# Patient Record
Sex: Male | Born: 1985 | Race: Black or African American | Hispanic: No | Marital: Single | Smoking: Current every day smoker
Health system: Southern US, Community
[De-identification: ages and names within clinical notes are randomized; demographics above are authoritative.]

## PROBLEM LIST (undated history)

## (undated) DIAGNOSIS — K4090 Unilateral inguinal hernia, without obstruction or gangrene, not specified as recurrent: Secondary | ICD-10-CM

## (undated) HISTORY — PX: HERNIA REPAIR: SHX51

---
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
## 2008-03-09 ENCOUNTER — Emergency Department (HOSPITAL_COMMUNITY): Admission: EM | Admit: 2008-03-09 | Discharge: 2008-03-09 | Payer: Self-pay | Admitting: Emergency Medicine

## 2008-04-05 ENCOUNTER — Emergency Department (HOSPITAL_COMMUNITY): Admission: EM | Admit: 2008-04-05 | Discharge: 2008-04-05 | Payer: Self-pay | Admitting: Emergency Medicine

## 2008-04-25 ENCOUNTER — Emergency Department (HOSPITAL_COMMUNITY): Admission: EM | Admit: 2008-04-25 | Discharge: 2008-04-25 | Payer: Self-pay | Admitting: Emergency Medicine

## 2008-04-28 ENCOUNTER — Emergency Department (HOSPITAL_COMMUNITY): Admission: EM | Admit: 2008-04-28 | Discharge: 2008-04-28 | Payer: Self-pay | Admitting: Emergency Medicine

## 2008-08-29 ENCOUNTER — Emergency Department (HOSPITAL_COMMUNITY): Admission: EM | Admit: 2008-08-29 | Discharge: 2008-08-29 | Payer: Self-pay | Admitting: Emergency Medicine

## 2009-06-17 ENCOUNTER — Emergency Department (HOSPITAL_COMMUNITY): Admission: EM | Admit: 2009-06-17 | Discharge: 2009-06-18 | Payer: Self-pay | Admitting: Emergency Medicine

## 2009-06-20 ENCOUNTER — Emergency Department (HOSPITAL_COMMUNITY): Admission: EM | Admit: 2009-06-20 | Discharge: 2009-06-20 | Payer: Self-pay | Admitting: Emergency Medicine

## 2009-06-24 ENCOUNTER — Emergency Department (HOSPITAL_COMMUNITY): Admission: EM | Admit: 2009-06-24 | Discharge: 2009-06-24 | Payer: Self-pay | Admitting: Emergency Medicine

## 2009-11-03 ENCOUNTER — Emergency Department (HOSPITAL_COMMUNITY): Admission: EM | Admit: 2009-11-03 | Discharge: 2009-11-03 | Payer: Self-pay | Admitting: Emergency Medicine

## 2010-10-15 ENCOUNTER — Emergency Department (HOSPITAL_COMMUNITY)
Admission: EM | Admit: 2010-10-15 | Discharge: 2010-10-15 | Payer: Self-pay | Source: Home / Self Care | Admitting: Emergency Medicine

## 2010-12-11 LAB — CBC
HCT: 41.5 % (ref 39.0–52.0)
Hemoglobin: 14 g/dL (ref 13.0–17.0)
MCHC: 33.9 g/dL (ref 30.0–36.0)
MCV: 90.5 fL (ref 78.0–100.0)
Platelets: 328 10*3/uL (ref 150–400)
RBC: 4.58 MIL/uL (ref 4.22–5.81)
RDW: 14.3 % (ref 11.5–15.5)
WBC: 12.5 10*3/uL — ABNORMAL HIGH (ref 4.0–10.5)

## 2010-12-11 LAB — COMPREHENSIVE METABOLIC PANEL
ALT: 13 U/L (ref 0–53)
Alkaline Phosphatase: 75 U/L (ref 39–117)
BUN: 8 mg/dL (ref 6–23)
Chloride: 102 mEq/L (ref 96–112)
Glucose, Bld: 107 mg/dL — ABNORMAL HIGH (ref 70–99)
Potassium: 4.3 mEq/L (ref 3.5–5.1)
Sodium: 136 mEq/L (ref 135–145)
Total Bilirubin: 0.5 mg/dL (ref 0.3–1.2)

## 2010-12-11 LAB — DIFFERENTIAL
Basophils Absolute: 0 10*3/uL (ref 0.0–0.1)
Basophils Relative: 0 % (ref 0–1)
Eosinophils Absolute: 0.3 10*3/uL (ref 0.0–0.7)
Monocytes Absolute: 1.1 10*3/uL — ABNORMAL HIGH (ref 0.1–1.0)
Neutro Abs: 9 10*3/uL — ABNORMAL HIGH (ref 1.7–7.7)
Neutrophils Relative %: 72 % (ref 43–77)

## 2010-12-26 LAB — WOUND CULTURE

## 2010-12-28 ENCOUNTER — Emergency Department (HOSPITAL_COMMUNITY)
Admission: EM | Admit: 2010-12-28 | Discharge: 2010-12-28 | Disposition: A | Discharge status: Home / Self Care | Payer: Self-pay | Attending: Emergency Medicine | Admitting: Emergency Medicine

## 2010-12-28 DIAGNOSIS — K029 Dental caries, unspecified: Secondary | ICD-10-CM | POA: Insufficient documentation

## 2010-12-28 DIAGNOSIS — K089 Disorder of teeth and supporting structures, unspecified: Secondary | ICD-10-CM | POA: Insufficient documentation

## 2010-12-28 DIAGNOSIS — R220 Localized swelling, mass and lump, head: Secondary | ICD-10-CM | POA: Insufficient documentation

## 2010-12-28 DIAGNOSIS — K047 Periapical abscess without sinus: Secondary | ICD-10-CM | POA: Insufficient documentation

## 2010-12-28 DIAGNOSIS — R221 Localized swelling, mass and lump, neck: Secondary | ICD-10-CM | POA: Insufficient documentation

## 2010-12-30 ENCOUNTER — Emergency Department (HOSPITAL_COMMUNITY): Payer: Self-pay

## 2010-12-30 ENCOUNTER — Emergency Department (HOSPITAL_COMMUNITY)
Admission: EM | Admit: 2010-12-30 | Discharge: 2010-12-30 | Disposition: A | Discharge status: Home / Self Care | Payer: Self-pay | Attending: Emergency Medicine | Admitting: Emergency Medicine

## 2010-12-30 DIAGNOSIS — R1310 Dysphagia, unspecified: Secondary | ICD-10-CM | POA: Insufficient documentation

## 2010-12-30 DIAGNOSIS — Y929 Unspecified place or not applicable: Secondary | ICD-10-CM | POA: Insufficient documentation

## 2010-12-30 DIAGNOSIS — K1370 Unspecified lesions of oral mucosa: Secondary | ICD-10-CM | POA: Insufficient documentation

## 2010-12-30 DIAGNOSIS — T360X5A Adverse effect of penicillins, initial encounter: Secondary | ICD-10-CM | POA: Insufficient documentation

## 2010-12-30 DIAGNOSIS — R220 Localized swelling, mass and lump, head: Secondary | ICD-10-CM | POA: Insufficient documentation

## 2010-12-30 DIAGNOSIS — R6884 Jaw pain: Secondary | ICD-10-CM | POA: Insufficient documentation

## 2010-12-30 LAB — POCT I-STAT, CHEM 8
BUN: 7 mg/dL (ref 6–23)
Creatinine, Ser: 1.3 mg/dL (ref 0.4–1.5)
Glucose, Bld: 105 mg/dL — ABNORMAL HIGH (ref 70–99)
Hemoglobin: 13.3 g/dL (ref 13.0–17.0)
Potassium: 3.6 mEq/L (ref 3.5–5.1)
Sodium: 143 mEq/L (ref 135–145)

## 2010-12-30 MED ORDER — IOHEXOL 300 MG/ML  SOLN
100.0000 mL | Freq: Once | INTRAMUSCULAR | Status: AC | PRN
  Administered 2010-12-30: 100 mL via INTRAVENOUS

## 2012-04-20 ENCOUNTER — Encounter (HOSPITAL_COMMUNITY): Payer: Self-pay | Admitting: Emergency Medicine

## 2012-04-20 ENCOUNTER — Emergency Department (HOSPITAL_COMMUNITY): Payer: Self-pay

## 2012-04-20 ENCOUNTER — Emergency Department (HOSPITAL_COMMUNITY)
Admission: EM | Admit: 2012-04-20 | Discharge: 2012-04-20 | Disposition: A | Discharge status: Home / Self Care | Payer: Self-pay | Attending: Emergency Medicine | Admitting: Emergency Medicine

## 2012-04-20 ENCOUNTER — Telehealth (INDEPENDENT_AMBULATORY_CARE_PROVIDER_SITE_OTHER): Payer: Self-pay

## 2012-04-20 DIAGNOSIS — K4090 Unilateral inguinal hernia, without obstruction or gangrene, not specified as recurrent: Secondary | ICD-10-CM

## 2012-04-20 DIAGNOSIS — K4091 Unilateral inguinal hernia, without obstruction or gangrene, recurrent: Secondary | ICD-10-CM

## 2012-04-20 DIAGNOSIS — I861 Scrotal varices: Secondary | ICD-10-CM | POA: Insufficient documentation

## 2012-04-20 LAB — URINALYSIS, ROUTINE W REFLEX MICROSCOPIC
Bilirubin Urine: NEGATIVE
Glucose, UA: NEGATIVE mg/dL
Hgb urine dipstick: NEGATIVE
Specific Gravity, Urine: 1.026 (ref 1.005–1.030)
Urobilinogen, UA: 4 mg/dL — ABNORMAL HIGH (ref 0.0–1.0)

## 2012-04-20 LAB — URINE MICROSCOPIC-ADD ON

## 2012-04-20 MED ORDER — OXYCODONE-ACETAMINOPHEN 5-325 MG PO TABS
2.0000 | ORAL_TABLET | Freq: Once | ORAL | Status: AC
  Administered 2012-04-20: 2 via ORAL
  Filled 2012-04-20: qty 2

## 2012-04-20 MED ORDER — OXYCODONE-ACETAMINOPHEN 5-325 MG PO TABS: 2.0000 | ORAL_TABLET | ORAL | Status: AC | PRN

## 2012-04-20 NOTE — ED Notes (Addendum)
Patient transported to ultrasound.

## 2012-04-20 NOTE — ED Notes (Signed)
PT. REPORTS RIGHT TESTICULAR PAIN /SWELLING FOR SEVERAL DAYS WORSE THIS EVENING , DENIES INJURY .

## 2012-04-20 NOTE — ED Notes (Signed)
Dr Norlene Campbell at bedside for ultrasound. Pt tolerating well.

## 2012-04-20 NOTE — Telephone Encounter (Signed)
I called and gave the pt an appointment for August 28th to come in and discuss his hernia per Dr Janee Morn

## 2012-04-20 NOTE — Consult Note (Signed)
Reason for Consult:Right inguinal hernia Referring Physician: Khaled Herda is an 26 y.o. male.  HPI: Patient developed right inguinal pain and swelling a few months ago after lifting some heavy kegs at [employer] where he works.  The pain and swelling in his right groin and then coming and going for several weeks. It worsened over the past day or 2 and he came to the emergency department for further evaluation. He was found to have right inguinal hernia on examination and Dr.Otter asked to see him in consultation. He also underwent scrotal ultrasound. I reviewed this with the radiologist. It demonstrates good arterial and venous blood flow to both testicles and omental fat is visualized in the right inguinal hernia. No evidence of significant hydrocele.  History reviewed. No pertinent past medical history.  History reviewed. No pertinent past surgical history.  No family history on file.  Social History:  reports that he has been smoking.  He does not have any smokeless tobacco history on file. He reports that he does not drink alcohol or use illicit drugs.  Allergies:  Allergies  Allergen Reactions  . Aspirin Swelling    Swelling under tongue    Medications: I have reviewed the patient's current medications.  No results found for this or any previous visit (from the past 48 hour(s)).  US Scrotum  04/20/2012  *RADIOLOGY REPORT*  Clinical Data: Right scrotal pain and swelling  ULTRASOUND OF SCROTUM  Technique:  Complete ultrasound examination of the testicles, epididymis, and other scrotal structures was performed.  Comparison:  None.  Findings:  Right testis:  Measures 4.0 x 2.4 x 2.9 cm.  Color Doppler flow with arterial and venous wave forms documented.  Left testis:  Measures 4.1 x 2.4 x 2.8 cm.  Color Doppler flow with arterial venous wave forms documented.  Right epididymis:  Normal size and appearance.  Left epididymis:  Normal size and appearance.  Hydrocele:  Not identified   Varicocele:  Identified on the left  Fullness in the region of the right inguinal canal may reflect inflammation of the spermatic cord or herniated mesenteric fat.  IMPRESSION: Color Doppler flow with arterial and venous wave forms documented to both testicles.  Normal sonographic appearance to the testicles and epididymides.  Fullness of the right inguinal canal may reflect a fat containing inguinal hernia or inflammation of the spermatic cord.  Original Report Authenticated By: Waneta Martins, M.D.   [place] Art/ven Flow Abd Pelv Doppler  04/20/2012  *RADIOLOGY REPORT*  Clinical Data: Right scrotal pain and swelling  ULTRASOUND OF SCROTUM  Technique:  Complete ultrasound examination of the testicles, epididymis, and other scrotal structures was performed.  Comparison:  None.  Findings:  Right testis:  Measures 4.0 x 2.4 x 2.9 cm.  Color Doppler flow with arterial and venous wave forms documented.  Left testis:  Measures 4.1 x 2.4 x 2.8 cm.  Color Doppler flow with arterial venous wave forms documented.  Right epididymis:  Normal size and appearance.  Left epididymis:  Normal size and appearance.  Hydrocele:  Not identified  Varicocele:  Identified on the left  Fullness in the region of the right inguinal canal may reflect inflammation of the spermatic cord or herniated mesenteric fat.  IMPRESSION: Color Doppler flow with arterial and venous wave forms documented to both testicles.  Normal sonographic appearance to the testicles and epididymides.  Fullness of the right inguinal canal may reflect a fat containing inguinal hernia or inflammation of the spermatic cord.  Original Report Authenticated By: Waneta Martins, M.D.    Review of Systems  Constitutional: Negative.   HENT: Negative.   Eyes: Negative.   Respiratory: Negative.   Cardiovascular: Negative.   Gastrointestinal: Negative for nausea, vomiting, abdominal pain, diarrhea and constipation.  Genitourinary:       Right scrotal swelling  intermittently, see history of present illness  Musculoskeletal: Negative.   Skin: Negative.   Neurological: Negative.    Blood pressure 158/67, pulse 83, temperature 98.5 F (36.9 C), temperature source Oral, resp. rate 20, SpO2 99.00%. Physical Exam  Constitutional: He is oriented to person, place, and time. He appears well-developed and well-nourished. No distress.  Eyes: Pupils are equal, round, and reactive to light. No scleral icterus.       Mild conjunctival injection  Neck: Normal range of motion. Neck supple. No tracheal deviation present.  Cardiovascular: Normal rate, regular rhythm and normal heart sounds.   Respiratory: Effort normal and breath sounds normal. No respiratory distress. He has no wheezes.  GI: Soft. He exhibits no distension. There is no tenderness. There is no rebound and no guarding.       Inguinal exam reveals both testes are descended. He has a large right inguinal hernia extending into the scrotum. This is partially reducible with mild tenderness. It tends to spontaneously recur. No left inguinal hernia.  Genitourinary: Penis normal.  Musculoskeletal: Normal range of motion.  Neurological: He is alert and oriented to person, place, and time.       Moves all extremities well, voice clear and fluent  Skin:       Tattoos bilateral upper extremity    Assessment/Plan: Large right inguinal hernia extending into the scrotum which is becoming more symptomatic. I will see the patient in my office to schedule elective repair. The pain and swelling in his right groin occurred after lifting some heavy kegs at work.  He plans to speak with his supervisor regarding possible workers compensation. I discussed the natural history of inguinal hernias as well as the details of surgery for repair. I also gave him symptoms to look for should incarceration or strangulation occur. I answered his questions. I also spoke with Dr.Otter who plans on giving the patient some discharge  instructions and pain medication. I will see him in the office.  Boen Sterbenz E 04/20/2012, 3:02 AM

## 2012-04-20 NOTE — ED Notes (Signed)
Pt states friend driving home

## 2012-04-25 NOTE — ED Provider Notes (Signed)
History     CSN: [id number]  Arrival date & time 04/20/12  0057   First MD Initiated Contact with Patient 04/20/12 0112      Chief Complaint  Patient presents with  . Testicle Pain    (Consider location/radiation/quality/duration/timing/severity/associated sxs/prior treatment) HPI  26 yo male presents to the ER with c/o right testicular pain and swelling.  Pt reports sxs have been ongoing for about a month, worse over the last week and much worse today.  Pt denies any injury, no new sexual partners, no penile d/c, no rectal pain, no groin swelling or bulge.  Pt has not seen a doctor about this before.  Pt reports his scrotum has been steadily swelling.  No h/o torsion.  History reviewed. No pertinent past medical history.  History reviewed. No pertinent past surgical history.  No family history on file.  History  Substance Use Topics  . Smoking status: Current Everyday Smoker  . Smokeless tobacco: Not on file  . Alcohol Use: No      Review of Systems  All other systems reviewed and are negative.    Allergies  Aspirin  Home Medications   Current Outpatient Rx  Name Route Sig Dispense Refill  . OXYCODONE-ACETAMINOPHEN 5-325 MG PO TABS Oral Take 2 tablets by mouth every 4 (four) hours as needed for pain. 20 tablet 0    BP 151/78  Pulse 68  Temp 98.5 F (36.9 C) (Oral)  Resp 20  SpO2 99%  Physical Exam  Nursing note and vitals reviewed. Constitutional: He is oriented to person, place, and time. He appears well-developed and well-nourished.  HENT:  Head: Normocephalic and atraumatic.  Nose: Nose normal.  Mouth/Throat: Oropharynx is clear and moist.  Eyes: Conjunctivae and EOM are normal. Pupils are equal, round, and reactive to light.  Neck: Normal range of motion. Neck supple. No JVD present. No tracheal deviation present. No thyromegaly present.  Cardiovascular: Normal rate, regular rhythm, normal heart sounds and intact distal pulses.  Exam reveals no  gallop and no friction rub.   No murmur heard. Pulmonary/Chest: Effort normal and breath sounds normal. No stridor. No respiratory distress. He has no wheezes. He has no rales. He exhibits no tenderness.  Abdominal: Soft. Bowel sounds are normal. He exhibits no distension and no mass. There is no tenderness. There is no rebound and no guarding. Inguinal: large swelling to right scrotum, scrotal skin tense.  bedside u/s does not show bowel, nl testes lie.  Genitourinary: Penis normal. Right testis shows mass, swelling and tenderness. Right testis is descended. Cremasteric reflex is not absent on the right side. Left testis shows no mass, no swelling and no tenderness. Left testis is descended. Cremasteric reflex is not absent on the left side. No penile tenderness.  Musculoskeletal: Normal range of motion. He exhibits no edema and no tenderness.  Lymphadenopathy:    He has no cervical adenopathy.  Neurological: He is alert and oriented to person, place, and time. He exhibits normal muscle tone. Coordination normal.  Skin: Skin is warm and dry. No rash noted. No erythema. No pallor.  Psychiatric: He has a normal mood and affect. His behavior is normal. Judgment and thought content normal.    ED Course  Procedures (including critical care time)    Results for orders placed during the hospital encounter of 04/20/12  URINALYSIS, ROUTINE W REFLEX MICROSCOPIC      Component Value Range   Color, Urine YELLOW  YELLOW   APPearance CLEAR  CLEAR  Specific Gravity, Urine 1.026  1.005 - 1.030   pH 7.5  5.0 - 8.0   Glucose, UA NEGATIVE  NEGATIVE mg/dL   Hgb urine dipstick NEGATIVE  NEGATIVE   Bilirubin Urine NEGATIVE  NEGATIVE   Ketones, ur NEGATIVE  NEGATIVE mg/dL   Protein, ur NEGATIVE  NEGATIVE mg/dL   Urobilinogen, UA 4.0 (*) 0.0 - 1.0 mg/dL   Nitrite NEGATIVE  NEGATIVE   Leukocytes, UA MODERATE (*) NEGATIVE  URINE MICROSCOPIC-ADD ON      Component Value Range   Squamous Epithelial / LPF  RARE  RARE   WBC, UA 21-50  <3 WBC/hpf   Bacteria, UA RARE  RARE   US Scrotum  04/20/2012  *RADIOLOGY REPORT*  Clinical Data: Right scrotal pain and swelling  ULTRASOUND OF SCROTUM  Technique:  Complete ultrasound examination of the testicles, epididymis, and other scrotal structures was performed.  Comparison:  None.  Findings:  Right testis:  Measures 4.0 x 2.4 x 2.9 cm.  Color Doppler flow with arterial and venous wave forms documented.  Left testis:  Measures 4.1 x 2.4 x 2.8 cm.  Color Doppler flow with arterial venous wave forms documented.  Right epididymis:  Normal size and appearance.  Left epididymis:  Normal size and appearance.  Hydrocele:  Not identified  Varicocele:  Identified on the left  Fullness in the region of the right inguinal canal may reflect inflammation of the spermatic cord or herniated mesenteric fat.  IMPRESSION: Color Doppler flow with arterial and venous wave forms documented to both testicles.  Normal sonographic appearance to the testicles and epididymides.  Fullness of the right inguinal canal may reflect a fat containing inguinal hernia or inflammation of the spermatic cord.  Original Report Authenticated By: Waneta Martins, M.D.   [place] Art/ven Flow Abd Pelv Doppler  04/20/2012  *RADIOLOGY REPORT*  Clinical Data: Right scrotal pain and swelling  ULTRASOUND OF SCROTUM  Technique:  Complete ultrasound examination of the testicles, epididymis, and other scrotal structures was performed.  Comparison:  None.  Findings:  Right testis:  Measures 4.0 x 2.4 x 2.9 cm.  Color Doppler flow with arterial and venous wave forms documented.  Left testis:  Measures 4.1 x 2.4 x 2.8 cm.  Color Doppler flow with arterial venous wave forms documented.  Right epididymis:  Normal size and appearance.  Left epididymis:  Normal size and appearance.  Hydrocele:  Not identified  Varicocele:  Identified on the left  Fullness in the region of the right inguinal canal may reflect inflammation of the  spermatic cord or herniated mesenteric fat.  IMPRESSION: Color Doppler flow with arterial and venous wave forms documented to both testicles.  Normal sonographic appearance to the testicles and epididymides.  Fullness of the right inguinal canal may reflect a fat containing inguinal hernia or inflammation of the spermatic cord.  Original Report Authenticated By: Waneta Martins, M.D.      1. Inguinal hernia recurrent unilateral       MDM  26 yo male with right inguinal hernia.  Unable to reduce without spontaneous return of contents.  D/w surgery who has seen in the ED and will f/u with outpatient.        Olivia Mackie, MD 04/25/12 2119

## 2012-05-18 ENCOUNTER — Ambulatory Visit (INDEPENDENT_AMBULATORY_CARE_PROVIDER_SITE_OTHER): Payer: Self-pay | Admitting: General Surgery

## 2012-06-01 ENCOUNTER — Ambulatory Visit (INDEPENDENT_AMBULATORY_CARE_PROVIDER_SITE_OTHER): Payer: Self-pay | Admitting: General Surgery

## 2012-06-01 ENCOUNTER — Encounter (INDEPENDENT_AMBULATORY_CARE_PROVIDER_SITE_OTHER): Payer: Self-pay | Admitting: General Surgery

## 2012-06-01 VITALS — BP 119/77 | HR 84 | Temp 97.5°F | Resp 18 | Ht 69.5 in | Wt 208.8 lb

## 2012-06-01 DIAGNOSIS — K4090 Unilateral inguinal hernia, without obstruction or gangrene, not specified as recurrent: Secondary | ICD-10-CM | POA: Insufficient documentation

## 2012-06-01 NOTE — Progress Notes (Signed)
Patient ID: Patrick Pierce, male   DOB: 01-02-1986, 26 y.o.   MRN: [id number]  Chief Complaint  Patient presents with  . Hernia    HPI Patrick Pierce is a 26 y.o. male.  RIH HPI I saw the patient in the emergency room for large writing hernia. It extended into the scrotum but has been reducible. His pain is better but the hernia still goes in and out frequently and gives him pain. No change in bowel or bladder habits.  History reviewed. No pertinent past medical history.  History reviewed. No pertinent past surgical history.  History reviewed. No pertinent family history.  Social History History  Substance Use Topics  . Smoking status: Current Every Day Smoker -- 1.5 packs/day  . Smokeless tobacco: Not on file  . Alcohol Use: No    Allergies  Allergen Reactions  . Aspirin Swelling    Swelling under tongue    No current outpatient prescriptions on file.    Review of Systems Review of Systems  Constitutional: Negative for fever, chills and unexpected weight change.  HENT: Negative for hearing loss, congestion, sore throat, trouble swallowing and voice change.   Eyes: Negative for visual disturbance.  Respiratory: Negative for cough and wheezing.   Cardiovascular: Negative for chest pain, palpitations and leg swelling.  Gastrointestinal: Negative for nausea, vomiting, abdominal pain, diarrhea, constipation, blood in stool, abdominal distention, anal bleeding and rectal pain.       See history of present illness, writing hernia  Genitourinary: Positive for scrotal swelling. Negative for hematuria and difficulty urinating.  Musculoskeletal: Negative for arthralgias.  Skin: Negative for rash and wound.  Neurological: Negative for seizures, syncope, weakness and headaches.  Hematological: Negative for adenopathy. Does not bruise/bleed easily.  Psychiatric/Behavioral: Negative for confusion.    Blood pressure 119/77, pulse 84, temperature 97.5 F (36.4 C), temperature source  Temporal, resp. rate 18, height 5' 9.5" (1.765 m), weight 208 lb 12.8 oz (94.711 kg).  Physical Exam Physical Exam  Constitutional: He is oriented to person, place, and time. He appears well-developed and well-nourished.  HENT:  Head: Normocephalic and atraumatic.  Eyes: EOM are normal. Pupils are equal, round, and reactive to light.  Neck: Normal range of motion. No tracheal deviation present.  Cardiovascular: Normal rate, regular rhythm, normal heart sounds and intact distal pulses.   Pulmonary/Chest: Effort normal and breath sounds normal. No stridor. No respiratory distress. He has no wheezes. He has no rales.  Abdominal: Soft. Bowel sounds are normal. He exhibits no distension. There is no tenderness. There is no rebound.       Large right inguinal hernia extends into the scrotum but is reducible, mild discomfort, no left inguinal hernia, testicles non-edematous  Genitourinary: Penis normal.  Musculoskeletal: Normal range of motion.  Neurological: He is alert and oriented to person, place, and time.  Skin: Skin is warm.       Multiple tattoos    Data Reviewed Hospital consult  Assessment    Large right inguinal hernia    Plan    I've offered repair of this large renal hernia with mesh. This will be done under general anesthesia. Procedure, risks, and benefits were discussed in detail with the patient. He is agreeable. He is checking on his insurance from work but would like to schedule.       Madine Sarr E 06/01/2012, 9:57 AM

## 2012-10-20 ENCOUNTER — Emergency Department (HOSPITAL_COMMUNITY)
Admission: EM | Admit: 2012-10-20 | Discharge: 2012-10-20 | Disposition: A | Discharge status: Home / Self Care | Payer: No Typology Code available for payment source | Attending: Emergency Medicine | Admitting: Emergency Medicine

## 2012-10-20 ENCOUNTER — Encounter (HOSPITAL_COMMUNITY): Payer: Self-pay | Admitting: *Deleted

## 2012-10-20 DIAGNOSIS — Y92410 Unspecified street and highway as the place of occurrence of the external cause: Secondary | ICD-10-CM | POA: Insufficient documentation

## 2012-10-20 DIAGNOSIS — Z8719 Personal history of other diseases of the digestive system: Secondary | ICD-10-CM | POA: Insufficient documentation

## 2012-10-20 DIAGNOSIS — F17200 Nicotine dependence, unspecified, uncomplicated: Secondary | ICD-10-CM | POA: Insufficient documentation

## 2012-10-20 DIAGNOSIS — S43499A Other sprain of unspecified shoulder joint, initial encounter: Secondary | ICD-10-CM | POA: Insufficient documentation

## 2012-10-20 DIAGNOSIS — Y9389 Activity, other specified: Secondary | ICD-10-CM | POA: Insufficient documentation

## 2012-10-20 DIAGNOSIS — S46812A Strain of other muscles, fascia and tendons at shoulder and upper arm level, left arm, initial encounter: Secondary | ICD-10-CM

## 2012-10-20 DIAGNOSIS — S199XXA Unspecified injury of neck, initial encounter: Secondary | ICD-10-CM | POA: Insufficient documentation

## 2012-10-20 DIAGNOSIS — S0993XA Unspecified injury of face, initial encounter: Secondary | ICD-10-CM | POA: Insufficient documentation

## 2012-10-20 HISTORY — DX: Unilateral inguinal hernia, without obstruction or gangrene, not specified as recurrent: K40.90

## 2012-10-20 MED ORDER — CYCLOBENZAPRINE HCL 10 MG PO TABS: 10.0000 mg | ORAL_TABLET | Freq: Three times a day (TID) | ORAL | Status: DC | PRN

## 2012-10-20 MED ORDER — ACETAMINOPHEN 325 MG PO TABS
650.0000 mg | ORAL_TABLET | Freq: Once | ORAL | Status: AC
  Administered 2012-10-20: 650 mg via ORAL
  Filled 2012-10-20: qty 2
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
## 2012-10-21 NOTE — ED Provider Notes (Signed)
Medical screening examination/treatment/procedure(s) were performed by non-physician practitioner and as supervising physician I was immediately available for consultation/collaboration.   Glynn Octave, MD 10/21/12 0005

## 2013-04-26 ENCOUNTER — Emergency Department (HOSPITAL_COMMUNITY)
Admission: EM | Admit: 2013-04-26 | Discharge: 2013-04-26 | Discharge status: Against Medical Advice | Payer: Self-pay | Attending: Emergency Medicine | Admitting: Emergency Medicine

## 2013-04-26 ENCOUNTER — Encounter (HOSPITAL_COMMUNITY): Payer: Self-pay | Admitting: Emergency Medicine

## 2013-04-26 DIAGNOSIS — R109 Unspecified abdominal pain: Secondary | ICD-10-CM | POA: Insufficient documentation

## 2013-04-26 DIAGNOSIS — R1909 Other intra-abdominal and pelvic swelling, mass and lump: Secondary | ICD-10-CM | POA: Insufficient documentation

## 2013-04-26 DIAGNOSIS — K4090 Unilateral inguinal hernia, without obstruction or gangrene, not specified as recurrent: Secondary | ICD-10-CM | POA: Insufficient documentation

## 2013-04-26 DIAGNOSIS — F17200 Nicotine dependence, unspecified, uncomplicated: Secondary | ICD-10-CM | POA: Insufficient documentation

## 2013-04-26 NOTE — ED Notes (Signed)
Called Patient x3. No answer. Registration called x2. No answer.

## 2013-04-26 NOTE — ED Notes (Signed)
Pt c/o hernia to right groin x 6 months with pain and swelling

## 2013-10-18 ENCOUNTER — Emergency Department (HOSPITAL_COMMUNITY): Payer: Self-pay

## 2013-10-18 ENCOUNTER — Emergency Department (HOSPITAL_COMMUNITY)
Admission: EM | Admit: 2013-10-18 | Discharge: 2013-10-18 | Disposition: A | Discharge status: Home / Self Care | Payer: Self-pay | Attending: Emergency Medicine | Admitting: Emergency Medicine

## 2013-10-18 ENCOUNTER — Encounter (HOSPITAL_COMMUNITY): Payer: Self-pay | Admitting: Emergency Medicine

## 2013-10-18 DIAGNOSIS — R059 Cough, unspecified: Secondary | ICD-10-CM

## 2013-10-18 DIAGNOSIS — IMO0002 Reserved for concepts with insufficient information to code with codable children: Secondary | ICD-10-CM | POA: Insufficient documentation

## 2013-10-18 DIAGNOSIS — J069 Acute upper respiratory infection, unspecified: Secondary | ICD-10-CM | POA: Insufficient documentation

## 2013-10-18 DIAGNOSIS — Z8719 Personal history of other diseases of the digestive system: Secondary | ICD-10-CM | POA: Insufficient documentation

## 2013-10-18 DIAGNOSIS — R05 Cough: Secondary | ICD-10-CM

## 2013-10-18 DIAGNOSIS — F17200 Nicotine dependence, unspecified, uncomplicated: Secondary | ICD-10-CM | POA: Insufficient documentation

## 2013-10-18 MED ORDER — FLUTICASONE PROPIONATE 50 MCG/ACT NA SUSP
2.0000 | Freq: Every day | NASAL | Status: DC
Start: 2013-10-18 — End: 2015-07-05

## 2013-10-18 NOTE — Discharge Instructions (Signed)
Rest, stay well-hydrated. Use Flonase as directed. He may also use nasal saline rinses. Take Tylenol or ibuprofen every 6 hours as needed for your pain.   Cough, Adult  A cough is a reflex that helps clear your throat and airways. It can help heal the body or may be a reaction to an irritated airway. A cough may only last 2 or 3 weeks (acute) or may last more than 8 weeks (chronic).  CAUSES Acute cough:  Viral or bacterial infections. Chronic cough:  Infections.  Allergies.  Asthma.  Post-nasal drip.  Smoking.  Heartburn or acid reflux.  Some medicines.  Chronic lung problems (COPD).  Cancer. SYMPTOMS   Cough.  Fever.  Chest pain.  Increased breathing rate.  High-pitched whistling sound when breathing (wheezing).  Colored mucus that you cough up (sputum). TREATMENT   A bacterial cough may be treated with antibiotic medicine.  A viral cough must run its course and will not respond to antibiotics.  Your caregiver may recommend other treatments if you have a chronic cough. HOME CARE INSTRUCTIONS   Only take over-the-counter or prescription medicines for pain, discomfort, or fever as directed by your caregiver. Use cough suppressants only as directed by your caregiver.  Use a cold steam vaporizer or humidifier in your bedroom or home to help loosen secretions.  Sleep in a semi-upright position if your cough is worse at night.  Rest as needed.  Stop smoking if you smoke. SEEK IMMEDIATE MEDICAL CARE IF:   You have pus in your sputum.  Your cough starts to worsen.  You cannot control your cough with suppressants and are losing sleep.  You begin coughing up blood.  You have difficulty breathing.  You develop pain which is getting worse or is uncontrolled with medicine.  You have a fever. MAKE SURE YOU:   Understand these instructions.  Will watch your condition.  Will get help right away if you are not doing well or get worse. Document  Released: 03/06/2011 Document Revised: 11/30/2011 Document Reviewed: 03/06/2011 [facility] Patient Information 2014 [place], [place].  Upper Respiratory Infection, Adult An upper respiratory infection (URI) is also sometimes known as the common cold. The upper respiratory tract includes the nose, sinuses, throat, trachea, and bronchi. Bronchi are the airways leading to the lungs. Most people improve within 1 week, but symptoms can last up to 2 weeks. A residual cough may last even longer.  CAUSES Many different viruses can infect the tissues lining the upper respiratory tract. The tissues become irritated and inflamed and often become very moist. Mucus production is also common. A cold is contagious. You can easily spread the virus to others by oral contact. This includes kissing, sharing a glass, coughing, or sneezing. Touching your mouth or nose and then touching a surface, which is then touched by another person, can also spread the virus. SYMPTOMS  Symptoms typically develop 1 to 3 days after you come in contact with a cold virus. Symptoms vary from person to person. They may include:  Runny nose.  Sneezing.  Nasal congestion.  Sinus irritation.  Sore throat.  Loss of voice (laryngitis).  Cough.  Fatigue.  Muscle aches.  Loss of appetite.  Headache.  Low-grade fever. DIAGNOSIS  You might diagnose your own cold based on familiar symptoms, since most people get a cold 2 to 3 times a year. Your caregiver can confirm this based on your exam. Most importantly, your caregiver can check that your symptoms are not due to another disease such  as strep throat, sinusitis, pneumonia, asthma, or epiglottitis. Blood tests, throat tests, and X-rays are not necessary to diagnose a common cold, but they may sometimes be helpful in excluding other more serious diseases. Your caregiver will decide if any further tests are required. RISKS AND COMPLICATIONS  You may be at risk for a more severe case  of the common cold if you smoke cigarettes, have chronic heart disease (such as heart failure) or lung disease (such as asthma), or if you have a weakened immune system. The very young and very old are also at risk for more serious infections. Bacterial sinusitis, middle ear infections, and bacterial pneumonia can complicate the common cold. The common cold can worsen asthma and chronic obstructive pulmonary disease (COPD). Sometimes, these complications can require emergency medical care and may be life-threatening. PREVENTION  The best way to protect against getting a cold is to practice good hygiene. Avoid oral or hand contact with people with cold symptoms. Wash your hands often if contact occurs. There is no clear evidence that vitamin C, vitamin E, echinacea, or exercise reduces the chance of developing a cold. However, it is always recommended to get plenty of rest and practice good nutrition. TREATMENT  Treatment is directed at relieving symptoms. There is no cure. Antibiotics are not effective, because the infection is caused by a virus, not by bacteria. Treatment may include:  Increased fluid intake. Sports drinks offer valuable electrolytes, sugars, and fluids.  Breathing heated mist or steam (vaporizer or shower).  Eating chicken soup or other clear broths, and maintaining good nutrition.  Getting plenty of rest.  Using gargles or lozenges for comfort.  Controlling fevers with ibuprofen or acetaminophen as directed by your caregiver.  Increasing usage of your inhaler if you have asthma. Zinc gel and zinc lozenges, taken in the first 24 hours of the common cold, can shorten the duration and lessen the severity of symptoms. Pain medicines may help with fever, muscle aches, and throat pain. A variety of non-prescription medicines are available to treat congestion and runny nose. Your caregiver can make recommendations and may suggest nasal or lung inhalers for other symptoms.  HOME CARE  INSTRUCTIONS   Only take over-the-counter or prescription medicines for pain, discomfort, or fever as directed by your caregiver.  Use a warm mist humidifier or inhale steam from a shower to increase air moisture. This may keep secretions moist and make it easier to breathe.  Drink enough water and fluids to keep your urine clear or pale yellow.  Rest as needed.  Return to work when your temperature has returned to normal or as your caregiver advises. You may need to stay home longer to avoid infecting others. You can also use a face mask and careful hand washing to prevent spread of the virus. SEEK MEDICAL CARE IF:   After the first few days, you feel you are getting worse rather than better.  You need your caregiver's advice about medicines to control symptoms.  You develop chills, worsening shortness of breath, or brown or red sputum. These may be signs of pneumonia.  You develop yellow or brown nasal discharge or pain in the face, especially when you bend forward. These may be signs of sinusitis.  You develop a fever, swollen neck glands, pain with swallowing, or white areas in the back of your throat. These may be signs of strep throat. SEEK IMMEDIATE MEDICAL CARE IF:   You have a fever.  You develop severe or persistent headache, ear  pain, sinus pain, or chest pain.  You develop wheezing, a prolonged cough, cough up blood, or have a change in your usual mucus (if you have chronic lung disease).  You develop sore muscles or a stiff neck. Document Released: 03/03/2001 Document Revised: 11/30/2011 Document Reviewed: 01/09/2011 [facility] Patient Information 2014 [place], [place].

## 2013-10-18 NOTE — ED Provider Notes (Signed)
CSN: [id number]     Arrival date & time 10/18/13  0804 History   First MD Initiated Contact with Patient 10/18/13 [phone]     Chief Complaint  Patient presents with  . Cough   (Consider location/radiation/quality/duration/timing/severity/associated sxs/prior Treatment) HPI Comments: Patient is a 28 year old male who presents to the emergency department complaining of cough and nasal congestion x2 days. Cough is occasionally productive with yellow mucus. States over the past day he has developed a sharp left-sided rib pain when he coughs only. This morning he was little nauseated. Denies fever, sore throat, earache. No sick contacts. No aggravating or alleviating factors. He is a smoker.  Patient is a 28 y.o. male presenting with cough. The history is provided by the patient.  Cough   Past Medical History  Diagnosis Date  . Hernia of testicle    History reviewed. No pertinent past surgical history. No family history on file. History  Substance Use Topics  . Smoking status: Current Every Day Smoker -- 0.50 packs/day    Types: Cigarettes  . Smokeless tobacco: Not on file  . Alcohol Use: No    Review of Systems  HENT: Positive for congestion.   Respiratory: Positive for cough.   Gastrointestinal: Positive for nausea.  All other systems reviewed and are negative.    Allergies  Aspirin  Home Medications   Current Outpatient Rx  Name  Route  Sig  Dispense  Refill  . DM-Doxylamine-Acetaminophen (TYLENOL COUGH/SORE THROAT) 15-6.25-500 MG/15ML MISC   Oral   Take 30 mLs by mouth 2 (two) times daily as needed (for cough).         . fluticasone (FLONASE) 50 MCG/ACT nasal spray   Each Nare   Place 2 sprays into both nostrils daily.   16 g   0    BP 124/61  Pulse 80  Temp(Src) 98 F (36.7 C)  Resp 16  SpO2 99% Physical Exam  Nursing note and vitals reviewed. Constitutional: He is oriented to person, place, and time. He appears well-developed and well-nourished. No  distress.  HENT:  Head: Normocephalic and atraumatic.  Nose: Mucosal edema present. Right sinus exhibits maxillary sinus tenderness. Left sinus exhibits maxillary sinus tenderness.  Mouth/Throat: Oropharynx is clear and moist.  Nasal congestion, post nasal drip.  Eyes: Conjunctivae are normal.  Neck: Normal range of motion. Neck supple.  Cardiovascular: Normal rate, regular rhythm and normal heart sounds.   Pulmonary/Chest: Effort normal. No respiratory distress. He exhibits no tenderness.  Ronchi left upper lung field cleared with cough.  Abdominal: Soft. Bowel sounds are normal. There is no tenderness.  Musculoskeletal: Normal range of motion. He exhibits no edema.  Neurological: He is alert and oriented to person, place, and time.  Skin: Skin is warm and dry. He is not diaphoretic.  Psychiatric: He has a normal mood and affect. His behavior is normal.    ED Course  Procedures (including critical care time) Labs Review Labs Reviewed - No data to display Imaging Review Dg Chest 2 View  10/18/2013   CLINICAL DATA:  28 year old male with cough and left-sided chest pain.  EXAM: CHEST  2 VIEW  COMPARISON:  DG CHEST 2 VIEW dated 06/24/2009  FINDINGS: normal lung volumes. Normal cardiac size and mediastinal contours. Visualized tracheal air column is within normal limits. The lungs are clear. No pneumothorax or effusion. Minimal upper thoracic scoliosis is stable. Otherwise no osseous abnormality identified.  IMPRESSION: Negative, no acute cardiopulmonary abnormality.   Electronically Signed   By: Nedra Hai  Margo AyeHall M.D.   On: 10/18/2013 09:16    EKG Interpretation   None       MDM   1. URI (upper respiratory infection)   2. Cough    Patient presented with cough and congestion. He is well appearing and in no apparent distress, afebrile with normal vital signs. Chest x-ray clear. Will give Flonase, discussed symptomatic treatment. Stable for discharge. Return precautions given. Patient states  understanding of treatment care plan and is agreeable.    Trevor MaceRobyn M Albert, PA-C 10/18/13 [phone]

## 2013-10-18 NOTE — ED Notes (Signed)
Pt states cough x 2 days, states has sharp pain to abdomen when he coughs. Also reports nausea in the mornings. No fever, sore throat.

## 2013-10-18 NOTE — ED Provider Notes (Signed)
Medical screening examination/treatment/procedure(s) were performed by non-physician practitioner and as supervising physician I was immediately available for consultation/collaboration.  Megan E Docherty, MD 10/18/13 2012 

## 2014-10-21 ENCOUNTER — Emergency Department (HOSPITAL_COMMUNITY)
Admission: EM | Admit: 2014-10-21 | Discharge: 2014-10-21 | Disposition: A | Discharge status: Home / Self Care | Payer: No Typology Code available for payment source | Attending: Emergency Medicine | Admitting: Emergency Medicine

## 2014-10-21 ENCOUNTER — Encounter (HOSPITAL_COMMUNITY): Payer: Self-pay | Admitting: Emergency Medicine

## 2014-10-21 DIAGNOSIS — Z720 Tobacco use: Secondary | ICD-10-CM | POA: Insufficient documentation

## 2014-10-21 DIAGNOSIS — K047 Periapical abscess without sinus: Secondary | ICD-10-CM

## 2014-10-21 DIAGNOSIS — K029 Dental caries, unspecified: Secondary | ICD-10-CM | POA: Insufficient documentation

## 2014-10-21 DIAGNOSIS — Z7951 Long term (current) use of inhaled steroids: Secondary | ICD-10-CM | POA: Insufficient documentation

## 2014-10-21 MED ORDER — IBUPROFEN 600 MG PO TABS: 600.0000 mg | ORAL_TABLET | Freq: Four times a day (QID) | ORAL | Status: DC | PRN

## 2014-10-21 MED ORDER — AMOXICILLIN 500 MG PO CAPS: 500.0000 mg | ORAL_CAPSULE | Freq: Three times a day (TID) | ORAL | Status: DC

## 2014-10-21 MED ORDER — HYDROCODONE-ACETAMINOPHEN 5-325 MG PO TABS: 1.0000 | ORAL_TABLET | Freq: Four times a day (QID) | ORAL | Status: DC | PRN

## 2014-10-21 NOTE — Discharge Instructions (Signed)
Take ibuprofen for pain. norco for severe pain. Take amoxil for infection until all gone. Follow up with [facility] or with an oral surgeon.   Return if worsening symptoms.    Dental Abscess A dental abscess is a collection of infected fluid (pus) from a bacterial infection in the inner part of the tooth (pulp). It usually occurs at the end of the tooth's root.  CAUSES   Severe tooth decay.  Trauma to the tooth that allows bacteria to enter into the pulp, such as a broken or chipped tooth. SYMPTOMS   Severe pain in and around the infected tooth.  Swelling and redness around the abscessed tooth or in the mouth or face.  Tenderness.  Pus drainage.  Bad breath.  Bitter taste in the mouth.  Difficulty swallowing.  Difficulty opening the mouth.  Nausea.  Vomiting.  Chills.  Swollen neck glands. DIAGNOSIS   A medical and dental history will be taken.  An examination will be performed by tapping on the abscessed tooth.  X-rays may be taken of the tooth to identify the abscess. TREATMENT The goal of treatment is to eliminate the infection. You may be prescribed antibiotic medicine to stop the infection from spreading. A root canal may be performed to save the tooth. If the tooth cannot be saved, it may be pulled (extracted) and the abscess may be drained.  HOME CARE INSTRUCTIONS  Only take over-the-counter or prescription medicines for pain, fever, or discomfort as directed by your caregiver.  Rinse your mouth (gargle) often with salt water ( tsp salt in 8 oz [250 ml] of warm water) to relieve pain or swelling.  Do not drive after taking pain medicine (narcotics).  Do not apply heat to the outside of your face.  Return to your dentist for further treatment as directed. SEEK MEDICAL CARE IF:  Your pain is not helped by medicine.  Your pain is getting worse instead of better. SEEK IMMEDIATE MEDICAL CARE IF:  You have a fever or persistent symptoms for more  than 2-3 days.  You have a fever and your symptoms suddenly get worse.  You have chills or a very bad headache.  You have problems breathing or swallowing.  You have trouble opening your mouth.  You have swelling in the neck or around the eye. Document Released: 09/07/2005 Document Revised: 06/01/2012 Document Reviewed: 12/16/2010 [facility] Patient Information 2015 [place]. This information is not intended to replace advice given to you by your health care provider. Make sure you discuss any questions you have with your health care provider.

## 2014-10-21 NOTE — ED Provider Notes (Signed)
CSN: [id number]     Arrival date & time 10/21/14  1523 History  This chart was scribed for non-physician practitioner, Jaynie Crumbleatyana Doryan Bahl, PA-C working with Nelia Shiobert L Beaton, MD by Greggory StallionKayla Andersen, ED scribe. This patient was seen in room TR06C/TR06C and the patient's care was started at 4:07 PM.   Chief Complaint  Patient presents with  . Dental Pain   The history is provided by the patient. No language interpreter was used.    HPI Comments: Patrick Pierce is a 29 y.o. male who presents to the Emergency Department complaining of intermittent left and right upper dental pain that started one month ago. States pain has started to worsen and become constant. He also reports worsening left sided facial swelling that started earlier today. His right tooth has been chipped for about 2 years. Pt has taken tylenol with no relief and ibuprofen with temporary relief. He does not a dentist. Denies fever, chills.   Past Medical History  Diagnosis Date  . Hernia of testicle    History reviewed. No pertinent past surgical history. No family history on file. History  Substance Use Topics  . Smoking status: Current Every Day Smoker -- 0.50 packs/day    Types: Cigarettes  . Smokeless tobacco: Not on file  . Alcohol Use: No    Review of Systems  Constitutional: Negative for fever and chills.  HENT: Positive for dental problem and facial swelling.   All other systems reviewed and are negative.  Allergies  Aspirin  Home Medications   Prior to Admission medications   Medication Sig Start Date End Date Taking? Authorizing Provider  DM-Doxylamine-Acetaminophen (TYLENOL COUGH/SORE THROAT) 15-6.25-500 MG/15ML MISC Take 30 mLs by mouth 2 (two) times daily as needed (for cough).    Historical Provider, MD  fluticasone (FLONASE) 50 MCG/ACT nasal spray Place 2 sprays into both nostrils daily. 10/18/13   Robyn M Hess, PA-C   BP 142/59 mmHg  Pulse 73  Temp(Src) 98.6 F (37 C)  Resp 16  SpO2 100%   Physical  Exam  Constitutional: He is oriented to person, place, and time. He appears well-developed and well-nourished. No distress.  HENT:  Head: Normocephalic and atraumatic.  Right Ear: Tympanic membrane and ear canal normal.  Left Ear: Tympanic membrane and ear canal normal.  Left facial swelling mainly over maxilla. Left upper 1st molar with carries and surrounding gum swelling TTP. Right upper 2nd bicuspid chipped with central carries, ttp. No surrounding gum swelling. No swelling under the tongue. No trismus.   Eyes: Conjunctivae and EOM are normal.  Neck: Normal range of motion. Neck supple. No tracheal deviation present.  Cardiovascular: Normal rate.   Pulmonary/Chest: Effort normal. No respiratory distress.  Musculoskeletal: Normal range of motion.  Neurological: He is alert and oriented to person, place, and time.  Skin: Skin is warm and dry.  Psychiatric: He has a normal mood and affect. His behavior is normal.  Nursing note and vitals reviewed.   ED Course  Procedures (including critical care time)  DIAGNOSTIC STUDIES: Oxygen Saturation is 100% on RA, normal by my interpretation.    COORDINATION OF CARE: 4:11 PM-Discussed treatment plan which includes an antibiotic and pain medication with pt at bedside and pt agreed to plan. Will give pt dental referrals and advised him to follow up.   Labs Review Labs Reviewed - No data to display  Imaging Review No results found.   EKG Interpretation None      MDM   Final diagnoses:  Dental abscess   Pt's exam consistent with a dental abscess. No evidence of ludwig's angina. Pt afebrile. Non toxic appearing. Home with dental follow up, amoxil for infection, ibuprofen, norco. Return precautions discussed.   Filed Vitals:   10/21/14 1527  BP: 142/59  Pulse: 73  Temp: 98.6 F (37 C)  Resp: 16  SpO2: 100%     I personally performed the services described in this documentation, which was scribed in my presence. The recorded  information has been reviewed and is accurate.  Lottie Mussel, PA-C 10/21/14 1624  Nelia Shi, MD 10/21/14 [phone]

## 2014-10-21 NOTE — ED Notes (Signed)
Declined W/C at D/C and was escorted to lobby by RN. 

## 2014-10-21 NOTE — ED Notes (Signed)
C/o L and R upper toothache x 1 month.  Swelling noted to L side of face.

## 2014-12-23 ENCOUNTER — Encounter (HOSPITAL_COMMUNITY): Payer: Self-pay

## 2014-12-23 ENCOUNTER — Emergency Department (HOSPITAL_COMMUNITY)
Admission: EM | Admit: 2014-12-23 | Discharge: 2014-12-23 | Disposition: A | Discharge status: Home / Self Care | Payer: Self-pay | Attending: Emergency Medicine | Admitting: Emergency Medicine

## 2014-12-23 DIAGNOSIS — Z792 Long term (current) use of antibiotics: Secondary | ICD-10-CM | POA: Insufficient documentation

## 2014-12-23 DIAGNOSIS — K029 Dental caries, unspecified: Secondary | ICD-10-CM | POA: Insufficient documentation

## 2014-12-23 DIAGNOSIS — Z8719 Personal history of other diseases of the digestive system: Secondary | ICD-10-CM | POA: Insufficient documentation

## 2014-12-23 DIAGNOSIS — K047 Periapical abscess without sinus: Secondary | ICD-10-CM | POA: Insufficient documentation

## 2014-12-23 DIAGNOSIS — Z7951 Long term (current) use of inhaled steroids: Secondary | ICD-10-CM | POA: Insufficient documentation

## 2014-12-23 DIAGNOSIS — Z720 Tobacco use: Secondary | ICD-10-CM | POA: Insufficient documentation

## 2014-12-23 MED ORDER — PENICILLIN V POTASSIUM 500 MG PO TABS: 500.0000 mg | ORAL_TABLET | Freq: Four times a day (QID) | ORAL | Status: AC

## 2014-12-23 MED ORDER — HYDROCODONE-ACETAMINOPHEN 5-325 MG PO TABS: 1.0000 | ORAL_TABLET | ORAL | Status: DC | PRN

## 2014-12-23 NOTE — ED Notes (Signed)
Declined W/C at D/C and was escorted to lobby by RN. 

## 2014-12-23 NOTE — ED Notes (Signed)
Pt reports left sided dental pain with swelling x 3 days. Reports similar episode 2 months ago, seen here but unable to afford f/u. Pt denies fever/chills.

## 2014-12-23 NOTE — ED Provider Notes (Signed)
CSN: [id number]     Arrival date & time 12/23/14  0865 History  This chart was scribed for non-physician practitioner Trixie Dredge, PA-C, working with Benjiman Core, MD by Littie Deeds, ED Scribe. This patient was seen in room TR05C/TR05C and the patient's care was started at 10:09 AM.      Chief Complaint  Patient presents with  . Dental Pain   The history is provided by the patient. No language interpreter was used.    HPI Comments: Patrick Pierce is a 29 y.o. male who presents to the Emergency Department complaining of gradual onset, intermittent, pressure-like left lower dental pain with swelling that started 3 days ago. Patient believes he may have a dental abscess. Initially, he only had swelling, but no pain; now he has severe pain to the area. He has tried hot compresses. Patient denies fever, sore throat, difficulty swallowing, and difficulty breathing.   Past Medical History  Diagnosis Date  . Hernia of testicle    History reviewed. No pertinent past surgical history. No family history on file. History  Substance Use Topics  . Smoking status: Current Every Day Smoker -- 0.50 packs/day    Types: Cigarettes  . Smokeless tobacco: Not on file  . Alcohol Use: No    Review of Systems  Constitutional: Negative for fever.  HENT: Positive for dental problem and facial swelling. Negative for trouble swallowing.   Respiratory: Negative for shortness of breath.   Musculoskeletal: Negative for neck pain and neck stiffness.  Skin: Negative for color change and rash.  Allergic/Immunologic: Negative for immunocompromised state.  Hematological: Does not bruise/bleed easily.  Psychiatric/Behavioral: Negative for self-injury.      Allergies  Aspirin  Home Medications   Prior to Admission medications   Medication Sig Start Date End Date Taking? Authorizing Provider  amoxicillin (AMOXIL) 500 MG capsule Take 1 capsule (500 mg total) by mouth 3 (three) times daily. 10/21/14   Jaynie Crumble, PA-C  DM-Doxylamine-Acetaminophen (TYLENOL COUGH/SORE THROAT) 15-6.25-500 MG/15ML MISC Take 30 mLs by mouth 2 (two) times daily as needed (for cough).    Historical Provider, MD  fluticasone (FLONASE) 50 MCG/ACT nasal spray Place 2 sprays into both nostrils daily. 10/18/13   Kathrynn Speed, PA-C  HYDROcodone-acetaminophen (NORCO) 5-325 MG per tablet Take 1 tablet by mouth every 6 (six) hours as needed for moderate pain. 10/21/14   Tatyana Kirichenko, PA-C  ibuprofen (ADVIL,MOTRIN) 600 MG tablet Take 1 tablet (600 mg total) by mouth every 6 (six) hours as needed. 10/21/14   Tatyana Kirichenko, PA-C   BP 140/50 mmHg  Pulse 77  Temp(Src) 97.8 F (36.6 C) (Oral)  Resp 18  SpO2 100% Physical Exam  Constitutional: He appears well-developed and well-nourished. No distress.  HENT:  Head: Normocephalic and atraumatic.  Mouth/Throat: Uvula is midline and oropharynx is clear and moist. Mucous membranes are not dry. No uvula swelling. No oropharyngeal exudate, posterior oropharyngeal edema, posterior oropharyngeal erythema or tonsillar abscesses.  Severe decay of left lower molars to the gumline, with adjacent gingival swelling. Purulent discharge. Associated facial swelling.   Neck: Normal range of motion. Neck supple.  No trismus, fullness, edema or tenderness of the anterior neck or submandibular space.  Cardiovascular: Normal rate.   Pulmonary/Chest: Effort normal and breath sounds normal. No stridor.  Lymphadenopathy:    He has no cervical adenopathy.  Neurological: He is alert.  Skin: He is not diaphoretic.  Nursing note and vitals reviewed.   ED Course  Procedures  DIAGNOSTIC STUDIES:  Oxygen Saturation is 100% on room air, normal by my interpretation.    COORDINATION OF CARE: 10:13 AM-Discussed treatment plan which includes antibiotics, pain medication and follow-up with oral surgeon with pt at bedside and pt agreed to plan.    Labs Review Labs Reviewed - No data to  display  Imaging Review No results found.   EKG Interpretation None      MDM   Final diagnoses:  Dental abscess    Afebrile, nontoxic patient with new dental pain and obvious abscess. No airway concerns. Doubt Ludwig's angina.  D/C home with antibiotic, pain medication and dental follow up.  No oral surgeon on call today.  Multiple resources given.  Discussed findings, treatment, and follow up  with patient.  Pt given return precautions.  Pt verbalizes understanding and agrees with plan.       I personally performed the services described in this documentation, which was scribed in my presence. The recorded information has been reviewed and is accurate.    Trixie Dredgemily Jiya Kissinger, PA-C 12/23/14 1111  Benjiman CoreNathan Pickering, MD 12/26/14 2212

## 2014-12-23 NOTE — Discharge Instructions (Signed)
Read the information below.  Use the prescribed medication as directed.  Please discuss all new medications with your pharmacist.  Do not take additional tylenol while taking the prescribed pain medication to avoid overdose.  You may return to the Emergency Department at any time for worsening condition or any new symptoms that concern you.  Please call the dentist listed above within 48 hours to schedule a close follow up appointment.  If you develop fevers, swelling in your face, difficulty swallowing or breathing, return to the ER immediately for a recheck.   ° ° °Dental Abscess °A dental abscess is a collection of infected fluid (pus) from a bacterial infection in the inner part of the tooth (pulp). It usually occurs at the end of the tooth's root.  °CAUSES  °· Severe tooth decay. °· Trauma to the tooth that allows bacteria to enter into the pulp, such as a broken or chipped tooth. °SYMPTOMS  °· Severe pain in and around the infected tooth. °· Swelling and redness around the abscessed tooth or in the mouth or face. °· Tenderness. °· Pus drainage. °· Bad breath. °· Bitter taste in the mouth. °· Difficulty swallowing. °· Difficulty opening the mouth. °· Nausea. °· Vomiting. °· Chills. °· Swollen neck glands. °DIAGNOSIS  °· A medical and dental history will be taken. °· An examination will be performed by tapping on the abscessed tooth. °· X-rays may be taken of the tooth to identify the abscess. °TREATMENT °The goal of treatment is to eliminate the infection. You may be prescribed antibiotic medicine to stop the infection from spreading. A root canal may be performed to save the tooth. If the tooth cannot be saved, it may be pulled (extracted) and the abscess may be drained.  °HOME CARE INSTRUCTIONS °· Only take over-the-counter or prescription medicines for pain, fever, or discomfort as directed by your caregiver. °· Rinse your mouth (gargle) often with salt water (¼ tsp salt in 8 oz [250 ml] of warm water) to  relieve pain or swelling. °· Do not drive after taking pain medicine (narcotics). °· Do not apply heat to the outside of your face. °· Return to your dentist for further treatment as directed. °SEEK MEDICAL CARE IF: °· Your pain is not helped by medicine. °· Your pain is getting worse instead of better. °SEEK IMMEDIATE MEDICAL CARE IF: °· You have a fever or persistent symptoms for more than 2-3 days. °· You have a fever and your symptoms suddenly get worse. °· You have chills or a very bad headache. °· You have problems breathing or swallowing. °· You have trouble opening your mouth. °· You have swelling in the neck or around the eye. °Document Released: 09/07/2005 Document Revised: 06/01/2012 Document Reviewed: 12/16/2010 °ExitCare® Patient Information ©2015 [employer]. This information is not intended to replace advice given to you by your health care provider. Make sure you discuss any questions you have with your health care provider. ° ° °Emergency Department Resource Guide °1) Find a Doctor and Pay Out of Pocket °Although you won't have to find out who is covered by your insurance plan, it is a good idea to ask around and get recommendations. You will then need to call the office and see if the doctor you have chosen will accept you as a new patient and what types of options they offer for patients who are self-pay. Some doctors offer discounts or will set up payment plans for their patients who do not have insurance, but you   will need to ask so you aren't surprised when you get to your appointment. ° °2) Contact Your Local Health Department °Not all health departments have doctors that can see patients for sick visits, but many do, so it is worth a call to see if yours does. If you don't know where your local health department is, you can check in your phone book. The CDC also has a tool to help you locate your state's health department, and many state websites also have listings of all of their local  health departments. ° °3) Find a Walk-in Clinic °If your illness is not likely to be very severe or complicated, you may want to try a walk in clinic. These are popping up all over the country in pharmacies, drugstores, and shopping centers. They're usually staffed by nurse practitioners or physician assistants that have been trained to treat common illnesses and complaints. They're usually fairly quick and inexpensive. However, if you have serious medical issues or chronic medical problems, these are probably not your best option. ° °No Primary Care Doctor: °- Call [facility] at  832-8000 - they can help you locate a primary care doctor that  accepts your insurance, provides certain services, etc. °- Physician Referral Service- 1-800-533-3463 ° °Chronic Pain Problems: °Organization         Address  Phone   Notes  °[facility]  (336) 297-2271 Patients need to be referred by their primary care doctor.  ° °Medication Assistance: °Organization         Address  Phone   Notes  °[place] [facility] [address] °[place], [place] [address] (336) 641-8030 --Must be a resident of [place] °-- Must have NO insurance coverage whatsoever (no Medicaid/ Medicare, etc.) °-- The pt. MUST have a primary care doctor that directs their care regularly and follows them in the community °  °[facility]  (866) 331-1348   °[employer]  (888) 892-1162   ° °Agencies that provide inexpensive medical care: °Organization         Address  Phone   Notes  °[facility]  (336) 832-8035   °[facility]    (336) 832-7272   °[facility] [address] °[place], [place] [address] (336) 832-4777   °[facility] [address], °[place] (336) 271-4999   °[facility]    (336) 373-0678   °[facility]    (336) 272-1050   °[facility] ° [address], [place] Phone:   (336) 832-4444, Fax:  (336) 832-4440 Hours of Operation:  9 am - 6 pm, M-F.  Also accepts Medicaid/Medicare and self-pay.  °[facility] ° [address], [place] Phone: (336) 832-3150, Fax: (336) 832-3151. Hours of Operation:  8:30 am - 5:30 pm, M-F.  Also accepts Medicaid and self-pay.  °[facility] [place] [address], [place] Phone: (336) 878-6027   °[facility] [address], [place], [place] (336)723-1848, Ext. 123 Mondays & Thursdays: 7-9 AM.  First 15 patients are seen on a first come, first serve basis. °  ° °Medicaid-accepting [place] Providers: ° °Organization         Address  Phone   Notes  °[facility] [address], [place] (336) 641-2100 Also accepts self-pay patients.  °[facility] [address],   [place] ° (336) 856-9996   °[facility] [address], [place] (336) 288-8857   °[facility] [address] [place] [address], [place] (336) 299-7000   °Veita Bland [address], [place]  ° (336) 373-1557 Only accepts [place] Access Medicaid patients after they have their name applied to their card.  ° °Self-Pay (no insurance) in [place]: ° °Organization         Address  Phone   Notes  °Sickle Cell Patients, [facility] [address], [place] (336) 832-1970   °[facility] [address], [place] (336) 832-4400   °[facility] ° [address], [place] (336) 992-4800   °[facility]/Dr. Osei-Bonsu ° [address] [place] [address], [place] or [address], [place] (336) 841-8500 Phone number for both [place] and [place] locations is the same.  °[facility] [address], [place] (336) 299-0000   °[facility] [address] [place] [address], [place] or [address] (336)  852-7530 °(336) 878-2260   °[facility] [address], [place] (336) 350-1642, phone; (336) 294-5005, fax Sees patients 1st and 3rd Saturday of every month.  Must not qualify for public or private insurance (i.e. Medicaid, Medicare, [place] Health Choice, Veterans' Benefits) • Household income should be no more than 200% of the poverty level •The clinic cannot treat you if you are pregnant or think you are pregnant • Sexually transmitted diseases are not treated at the clinic.  ° ° °Dental Care: °Organization         Address  Phone  Notes  °[place] [facility] [address], [place] (336) 641-6152 Accepts children up to age 21 who are enrolled in Medicaid or [place] Health Choice; pregnant women with a Medicaid card; and children who have applied for Medicaid or [place] Health Choice, but were declined, whose parents can pay a reduced fee at time of service.  °[facility] [place]  [address], [place] (336) 641-7733 Accepts children up to age 21 who are enrolled in Medicaid or  Health Choice; pregnant women with a Medicaid card; and children who have applied for Medicaid or  Health Choice, but were declined, whose parents can pay a reduced fee at time of service.  °[facility] ° [address],  (336) 641-4533 Patients are seen by appointment only. Walk-ins are not accepted. [facility] will see patients 18 years of age and older. °Monday - Tuesday (8am-5pm) °Most Wednesdays (8:30-5pm) °$30 per visit, cash only  °[facility] ° [address], [place] (336) 641-4533 Patients are seen by appointment only. Walk-ins are not accepted. [facility] will see patients 18 years of age and older. °One Wednesday Evening (Monthly: Volunteer Based).  $30 per visit, cash only  °[facility]  (919) 537-3737 for adults;  Children under age 4, call Graduate Pediatric Dentistry at (919) [phone]. Children aged 4-14, please call (919) [phone] to request a pediatric application. ° Dental services are provided in all areas of dental care including fillings, crowns and bridges, complete and partial dentures, implants, gum treatment, root canals, and extractions. Preventive care is also provided. Treatment is provided to both adults and children. °Patients are selected via a lottery and   there is often a waiting list. °  °[facility] [address], °[place] ° (336) 763-8833 [email or web address] °  °[facility] [address], [place], [place] (336)723-1848, Ext. 123 Second and Fourth Thursday of each month, opens at 6:30 AM; Clinic ends at 9 AM.  Patients are seen on a first-come first-served basis, and a limited number are seen during each clinic.  ° °[facility] ° [address], [place], [place] (336) 723-7904   Eligibility Requirements °You must have lived in [place], [place], or [place] counties for at least the last three months. °  You cannot be eligible for state or federal sponsored healthcare insurance, including Veterans Administration, Medicaid, or Medicare. °  You generally cannot be eligible for healthcare insurance through your employer.  °  How to apply: °Eligibility screenings are held every Tuesday and Wednesday afternoon from 1:00 pm until 4:00 pm. You do not need an appointment for the interview!  °[facility] [address], [place], [place] 336-631-2330   °[facility] [facility]  336-342-8273   °[facility]  336-703-3100   °[facility]  336-570-6415   ° °Behavioral Health Resources in the Community: °Intensive Outpatient Programs °Organization         Address  Phone  Notes  °[facility] [address], [place], [place] 336-878-6098   °[facility] [address], [place], [place] 336-832-9800   °[facility] [address], [place], [place] ° 336-882-2125   °[facility] [address],  °[place], [place] 1-800-853-5163 or 336-641-4981   °Substance Abuse Resources °Organization         Address  Phone  Notes  °[facility]  336-882-2125   °[facility]  336-784-9470   °[facility]  336-285-9073   °[facility]  336-845-3988   °Residential & Outpatient Substance Abuse Program  1-800-659-3381   °Psychological Services °Organization         Address  Phone  Notes  °[facility]  336- 832-9600   °[facility]  336- 378-7881   °[facility] [address], [place] 1-800-853-5163 or 336-641-4981   ° °Mobile Crisis Teams °Organization         Address  Phone  Notes  °[facility], Mobile Crisis Care Unit  1-877-626-1772   °Assertive °[facility] ° [address] [place], [place] 336-834-9664   °Sharon DeEsch [address] °[place] 336-554-5454   ° °Self-Help/Support Groups °Organization         Address  Phone             Notes  °[facility] - variety of support groups  336- 373-1402 Call for more information  °Narcotics Anonymous (NA), [facility] [address], °[place]  2 meetings at this location  ° °Residential Treatment Programs °Organization         Address  Phone  Notes  °[facility] [address],    °[place]  1-866-801-8205   °[facility] ° [address], [place], [place] 704-293-8524   °[facility] [address], [place] 336-845-3988 Admissions: 8am-3pm M-F  °[facility] [address]    °[place], [place] 336-841-1104   °[facility] [address], [place], [place] 336-379-7146   °  [facility] [address]  °[place], [place] 336-285-9073   °[facility] - Intensive  Outpatient [address], [place], [place] 336-852-3033   °[facility] [address]  °[place], [place] 1-877-615-2722 or 336-784-9470   °[facility] [address] [place], [place] 336-227-7417 Accepts Medicaid  °[facility] [address]  °[place] 1-800-659-3381 Substance Abuse/Addiction Treatment  ° °[place] Behavioral Health Resources °Organization         Address  Phone  Notes  °[facility]  (888) 581-9988   °Julie Brannon, PhD [address] [place], [place]   (336) 349-5553 or (336) 951-0000   °[facility]   [address] °[place], [place] (336) 349-4454   °[facility] [address], [place], [place] (336) 342-8316 Insurance/Medicaid/sponsorship through [facility]  °[facility] [address]                                    [place], [place] (336) 342-8316 Therapy/tele-psych/case  °[facility] [address]  ° [place], [place] (336) 349-2233    °Dr. Arfeen  (336) 349-4544   °[facility] [place]  [employer] [place] [facility] 1) [address],  °2) [address], [place] °3)  [address], [place] (336) 349-3220 °(336) 342-7768 ° °(336) 342-8140   °[place] [facility] (336) 342-1394 or (336) 342-3537 (After Hours)    ° ° ° ° °

## 2015-01-18 NOTE — Progress Notes (Signed)
[facility] [address] regarding Vicodin prescription dropped off on April third and wondering if it can be filled.  Per chart review [facility] pain and prescribed PenV antibiotic and Vicodin 15 tablets for pain. Patient was also given resources for follow up.  Patient has not been seen in the ED since then. Per chart review also noted patient has had similar symptoms before but was unable to afford follow up.   Patient noted to have no pcp or no insurance.  Per Dorene [facility]Natalie [facility] get his prescription filled for his antibiotic.  [facility] notified pharmacist Dorene GrebeNatalie of above.  No further EDCM needs at this time.

## 2015-07-05 ENCOUNTER — Emergency Department (HOSPITAL_COMMUNITY): Payer: Self-pay

## 2015-07-05 ENCOUNTER — Encounter (HOSPITAL_COMMUNITY): Payer: Self-pay | Admitting: Emergency Medicine

## 2015-07-05 ENCOUNTER — Emergency Department (HOSPITAL_COMMUNITY)
Admission: EM | Admit: 2015-07-05 | Discharge: 2015-07-05 | Disposition: A | Discharge status: Home / Self Care | Payer: Self-pay | Attending: Emergency Medicine | Admitting: Emergency Medicine

## 2015-07-05 DIAGNOSIS — Z792 Long term (current) use of antibiotics: Secondary | ICD-10-CM | POA: Insufficient documentation

## 2015-07-05 DIAGNOSIS — Z7951 Long term (current) use of inhaled steroids: Secondary | ICD-10-CM | POA: Insufficient documentation

## 2015-07-05 DIAGNOSIS — Z720 Tobacco use: Secondary | ICD-10-CM | POA: Insufficient documentation

## 2015-07-05 DIAGNOSIS — K4090 Unilateral inguinal hernia, without obstruction or gangrene, not specified as recurrent: Secondary | ICD-10-CM | POA: Insufficient documentation

## 2015-07-05 MED ORDER — HYDROCODONE-ACETAMINOPHEN 5-325 MG PO TABS: 1.0000 | ORAL_TABLET | ORAL | Status: DC | PRN

## 2015-07-05 NOTE — ED Notes (Signed)
PA ordered that pt be sent to wait for acute bed.

## 2015-07-05 NOTE — ED Notes (Signed)
Patient to ED with C/O a right inguinal hernia. States that he has had it for about a year. States that it keeps getting larger and larger over time.  Denies any pain but states that it looks. Bad.  States that he is uninsured and cannot have it fixed right now.  States that his insurance does not start until January. RN notes a large right inguinal hernia.

## 2015-07-05 NOTE — ED Notes (Signed)
Pt c/o hernia x 2-3 years ago. Pt reports not really in pain.

## 2015-07-05 NOTE — Discharge Instructions (Signed)
Take the prescribed medication as directed if needed for pain. Follow-up with [facility] [place] [facility] to have hernia repaired.  Recommend to do this as soon as possible. Return to the ED for new or worsening symptoms.

## 2015-07-05 NOTE — ED Provider Notes (Signed)
CSN: [id number]     Arrival date & time 07/05/15  0920 History   First MD Initiated Contact with Patient 07/05/15 1011     Chief Complaint  Patient presents with  . Hernia     (Consider location/radiation/quality/duration/timing/severity/associated sxs/prior Treatment) The history is provided by the patient and medical records.   This is a 29 year old male here with right inguinal hernia best and present for the past 3 years. Patient states hernia has been getting larger over the years, causing him intermittent pain.  No pain currently. He states he was evaluated by general surgery for this and offered repair, however he does not have any medical insurance and is unable to pay out of pocket for this. He denies any difficulty urinating.  No fever, chills.  VSS.  Past Medical History  Diagnosis Date  . Hernia of testicle    History reviewed. No pertinent past surgical history. No family history on file. Social History  Substance Use Topics  . Smoking status: Current Every Day Smoker -- 0.50 packs/day    Types: Cigarettes  . Smokeless tobacco: None  . Alcohol Use: No    Review of Systems  Genitourinary: Positive for scrotal swelling.  All other systems reviewed and are negative.     Allergies  Aspirin  Home Medications   Prior to Admission medications   Medication Sig Start Date End Date Taking? Authorizing Provider  amoxicillin (AMOXIL) 500 MG capsule Take 1 capsule (500 mg total) by mouth 3 (three) times daily. 10/21/14   Jaynie Crumbleatyana Kirichenko, PA-C  DM-Doxylamine-Acetaminophen (TYLENOL COUGH/SORE THROAT) 15-6.25-500 MG/15ML MISC Take 30 mLs by mouth 2 (two) times daily as needed (for cough).    Historical Provider, MD  fluticasone (FLONASE) 50 MCG/ACT nasal spray Place 2 sprays into both nostrils daily. 10/18/13   Kathrynn Speedobyn M Hess, PA-C  HYDROcodone-acetaminophen (NORCO/VICODIN) 5-325 MG per tablet Take 1 tablet by mouth every 4 (four) hours as needed for moderate pain or severe  pain. 12/23/14   Trixie DredgeEmily West, PA-C   BP 115/98 mmHg  Pulse 81  Temp(Src) 98.6 F (37 C) (Oral)  Resp 18  Ht 5\' 7"  (1.702 m)  Wt 235 lb (106.595 kg)  BMI 36.80 kg/m2  SpO2 99%   Physical Exam  Constitutional: He is oriented to person, place, and time. He appears well-developed and well-nourished. No distress.  HENT:  Head: Normocephalic and atraumatic.  Mouth/Throat: Oropharynx is clear and moist.  Eyes: Conjunctivae and EOM are normal. Pupils are equal, round, and reactive to light.  Neck: Normal range of motion. Neck supple.  Cardiovascular: Normal rate, regular rhythm and normal heart sounds.   Pulmonary/Chest: Effort normal and breath sounds normal. No respiratory distress. He has no wheezes.  Abdominal: Soft. Bowel sounds are normal. A hernia is present. Hernia confirmed positive in the right inguinal area.  Genitourinary:  Very large right inguinal hernia with extension into right scrotum; overall non-tender and reducible; no overlying skin changes or warmth to touch  Musculoskeletal: Normal range of motion. He exhibits no edema.  Neurological: He is alert and oriented to person, place, and time.  Skin: Skin is warm and dry. He is not diaphoretic.  Psychiatric: He has a normal mood and affect.  Nursing note and vitals reviewed.   ED Course  Procedures (including critical care time) Labs Review Labs Reviewed - No data to display  Imaging Review [place] Scrotum  07/05/2015  CLINICAL DATA:  Inguinal hernia. EXAM: ULTRASOUND OF SCROTUM TECHNIQUE: Complete ultrasound examination of the testicles,  epididymis, and other scrotal structures was performed. COMPARISON:  Scrotal ultrasound 04/20/2012. FINDINGS: Right testicle Measurements: 4.0 x 2.6 x 3.0 cm, within normal limits. No mass or microlithiasis visualized. Left testicle Measurements: 4.1 x 2.0 x 3.0 cm, within normal limits. No mass or microlithiasis visualized. Right epididymis:  Normal in size and appearance. Left epididymis:   Normal in size and appearance. Hydrocele:  A moderate right-sided hydrocele appears simple. Varicocele:  Left-sided varicocele is evident. Fat and bowel are herniated into the right inguinal canal. IMPRESSION: 1. Fat and bowel herniated into the right inguinal canal. 2. Moderate size right-sided hydrocele. 3. Small left-sided varicocele. Electronically Signed   By: Marin Roberts M.D.   On: 07/05/2015 12:17   [place] Art/ven Flow Abd Pelv Doppler  07/05/2015  CLINICAL DATA:  Inguinal hernia. EXAM: ULTRASOUND OF SCROTUM TECHNIQUE: Complete ultrasound examination of the testicles, epididymis, and other scrotal structures was performed. COMPARISON:  Scrotal ultrasound 04/20/2012. FINDINGS: Right testicle Measurements: 4.0 x 2.6 x 3.0 cm, within normal limits. No mass or microlithiasis visualized. Left testicle Measurements: 4.1 x 2.0 x 3.0 cm, within normal limits. No mass or microlithiasis visualized. Right epididymis:  Normal in size and appearance. Left epididymis:  Normal in size and appearance. Hydrocele:  A moderate right-sided hydrocele appears simple. Varicocele:  Left-sided varicocele is evident. Fat and bowel are herniated into the right inguinal canal. IMPRESSION: 1. Fat and bowel herniated into the right inguinal canal. 2. Moderate size right-sided hydrocele. 3. Small left-sided varicocele. Electronically Signed   By: Marin Roberts M.D.   On: 07/05/2015 12:17   I have personally reviewed and evaluated these images and lab results as part of my medical decision-making.   EKG Interpretation None      MDM   Final diagnoses:  Inguinal hernia   29 year old male here with right inguinal hernia. Has been increasing in size over the past 3 years. Patient is afebrile, nontoxic. He has a very large right inguinal hernia with extension into the right scrotum. Hernia is overall nontender and is reducible. No skin changes or signs of infection. Given significantly increased size of hernia in  close proximity to testicle, repeat ultrasound was obtained which now reveals bowel involvement. No clinical or ultrasound findings concerning for incarceration/strangulation.  No testicular torsion, however there is noted hydrocele on right and varicocele on left.  Hernia remains non-tender, patient without any current pain.  Patient appears stable for discharge with general surgery follow-up--states he is planning for surgical repair in January when insurance takes effect.  Rx vicodin for PRN pain.  Discussed plan with patient, he/she acknowledged understanding and agreed with plan of care.  Return precautions given for new or worsening symptoms.  Garlon Hatchet, PA-C 07/05/15 1355  Gerhard Munch, MD 07/06/15 1754

## 2015-08-19 ENCOUNTER — Emergency Department (HOSPITAL_COMMUNITY)
Admission: EM | Admit: 2015-08-19 | Discharge: 2015-08-19 | Disposition: A | Discharge status: Home / Self Care | Payer: Self-pay | Attending: Emergency Medicine | Admitting: Emergency Medicine

## 2015-08-19 ENCOUNTER — Encounter (HOSPITAL_COMMUNITY): Payer: Self-pay | Admitting: Emergency Medicine

## 2015-08-19 DIAGNOSIS — J111 Influenza due to unidentified influenza virus with other respiratory manifestations: Secondary | ICD-10-CM | POA: Insufficient documentation

## 2015-08-19 DIAGNOSIS — L0291 Cutaneous abscess, unspecified: Secondary | ICD-10-CM

## 2015-08-19 DIAGNOSIS — L02214 Cutaneous abscess of groin: Secondary | ICD-10-CM | POA: Insufficient documentation

## 2015-08-19 DIAGNOSIS — R69 Illness, unspecified: Secondary | ICD-10-CM

## 2015-08-19 DIAGNOSIS — Z8719 Personal history of other diseases of the digestive system: Secondary | ICD-10-CM | POA: Insufficient documentation

## 2015-08-19 DIAGNOSIS — F17210 Nicotine dependence, cigarettes, uncomplicated: Secondary | ICD-10-CM | POA: Insufficient documentation

## 2015-08-19 LAB — I-STAT CHEM 8, ED
BUN: 9 mg/dL (ref 6–20)
CREATININE: 1.3 mg/dL — AB (ref 0.61–1.24)
Calcium, Ion: 1.05 mmol/L — ABNORMAL LOW (ref 1.12–1.23)
Chloride: 96 mmol/L — ABNORMAL LOW (ref 101–111)
Glucose, Bld: 92 mg/dL (ref 65–99)
HEMATOCRIT: 42 % (ref 39.0–52.0)
HEMOGLOBIN: 14.3 g/dL (ref 13.0–17.0)
POTASSIUM: 4.3 mmol/L (ref 3.5–5.1)
SODIUM: 131 mmol/L — AB (ref 135–145)
TCO2: 24 mmol/L (ref 0–100)

## 2015-08-19 MED ORDER — SULFAMETHOXAZOLE-TRIMETHOPRIM 800-160 MG PO TABS: 1.0000 | ORAL_TABLET | Freq: Two times a day (BID) | ORAL | Status: AC

## 2015-08-19 MED ORDER — SULFAMETHOXAZOLE-TRIMETHOPRIM 800-160 MG PO TABS
1.0000 | ORAL_TABLET | Freq: Once | ORAL | Status: AC
  Administered 2015-08-19: 1 via ORAL
  Filled 2015-08-19: qty 1

## 2015-08-19 MED ORDER — SODIUM CHLORIDE 0.9 % IV BOLUS (SEPSIS)
1000.0000 mL | Freq: Once | INTRAVENOUS | Status: AC
  Administered 2015-08-19: 1000 mL via INTRAVENOUS

## 2015-08-19 NOTE — ED Notes (Addendum)
Pt from home with c/o weakness, body aches, fever, and chills x 1 week.  Denies cough, congestions, runny nose, N/V/D.  Reports OTC medications not helping.  Pt in NAD, ambulatory to room, A&O.

## 2015-08-19 NOTE — ED Notes (Signed)
Pt reports having multiple cysts to right groin, hx of the same, and questioned their relationship to how he was feeling.  Notified Dr Hyacinth MeekerMiller.

## 2015-08-19 NOTE — ED Provider Notes (Signed)
CSN: [id number]     Arrival date & time 08/19/15  4098 History   First MD Initiated Contact with Patient 08/19/15 0818     Chief Complaint  Patient presents with  . Weakness  . Chills     (Consider location/radiation/quality/duration/timing/severity/associated sxs/prior Treatment) HPI Comments: The patient is an otherwise healthy 29 year old male who presents with one week of symptoms that started out as upper respiratory symptoms such as a mild sore throat and a runny nose and congestion, this has changed slightly and now he has mild shortness of breath, diffuse fatigue and myalgias. He states that his mouth feels very dry, he is drinking lots of water but can't seem to stay hydrated, he does not have much appetite but denies any dysuria or rectal bleeding. He does have diarrhea twice a day for the last several days. He denies abdominal pain, chest pain, headache, changes in vision.  He has not had any medications for this problem, it is persistent, gradually worsening  The history is provided by the patient.    Past Medical History  Diagnosis Date  . Hernia of testicle    History reviewed. No pertinent past surgical history. History reviewed. No pertinent family history. Social History  Substance Use Topics  . Smoking status: Current Every Day Smoker -- 0.50 packs/day    Types: Cigarettes  . Smokeless tobacco: Never Used  . Alcohol Use: No    Review of Systems  All other systems reviewed and are negative.     Allergies  Aspirin  Home Medications   Prior to Admission medications   Medication Sig Start Date End Date Taking? Authorizing Provider  ibuprofen (ADVIL,MOTRIN) 200 MG tablet Take 200 mg by mouth every 6 (six) hours as needed for fever.   Yes Historical Provider, MD  Pheniramine-PE-APAP (THERAFLU FLU & SORE THROAT PO) Take 1 Package by mouth every 6 (six) hours as needed (cough/flu symptoms).   Yes Historical Provider, MD  sulfamethoxazole-trimethoprim (BACTRIM  DS,SEPTRA DS) 800-160 MG tablet Take 1 tablet by mouth 2 (two) times daily. 08/19/15 08/26/15  Eber Hong, MD   BP 123/67 mmHg  Pulse 96  Temp(Src) 99.3 F (37.4 C) (Oral)  Resp 18  SpO2 98% Physical Exam  Constitutional: He appears well-developed and well-nourished. No distress.  HENT:  Head: Normocephalic and atraumatic.  Mouth/Throat: Oropharynx is clear and moist. No oropharyngeal exudate.  Normal appearing mucous membranes  Eyes: Conjunctivae and EOM are normal. Pupils are equal, round, and reactive to light. Right eye exhibits no discharge. Left eye exhibits no discharge. No scleral icterus.  Neck: Normal range of motion. Neck supple. No JVD present. No thyromegaly present.  Cardiovascular: Normal rate, regular rhythm, normal heart sounds and intact distal pulses.  Exam reveals no gallop and no friction rub.   No murmur heard. Pulmonary/Chest: Effort normal and breath sounds normal. No respiratory distress. He has no wheezes. He has no rales.  Clear lung sounds, speaks in full sentences  Abdominal: Soft. Bowel sounds are normal. He exhibits no distension and no mass. There is no tenderness.  Genitourinary:  Large inguinal hernia, patient states is unchanged for several years  Musculoskeletal: Normal range of motion. He exhibits tenderness ( Small area of subcentimeter induration in the right inguinal crease). He exhibits no edema.  Lymphadenopathy:    He has no cervical adenopathy.  Neurological: He is alert. Coordination normal.  Skin: Skin is warm and dry. No rash noted. No erythema.  Psychiatric: He has a normal mood and affect.  His behavior is normal.  Nursing note and vitals reviewed.   ED Course  Procedures (including critical care time) Labs Review Labs Reviewed  I-STAT CHEM 8, ED - Abnormal; Notable for the following:    Sodium 131 (*)    Chloride 96 (*)    Creatinine, Ser 1.30 (*)    Calcium, Ion 1.05 (*)    All other components within normal limits     Imaging Review No results found. I have personally reviewed and evaluated these images and lab results as part of my medical decision-making.   EKG Interpretation None      MDM   Final diagnoses:  Influenza-like illness  Abscess    The patient does not appear to be in distress, he is having increased thirst and fatigue, dry mouth, rule out diabetes, possible flulike illness, hydration, otherwise the patient is in no distress.  Labs are unremarkable, glucose is normal, creatinine is 1.3 similar to prior levels, sodium 131. The patient was given IV fluids, states he feels better, we'll place the patient on an antibiotic in case this indurated area in the right inguinal crease is related to a cellulitis or an early abscess. The patient is in agreement with the plan and appears stable for discharge.  Filed Vitals:   08/19/15 0847 08/19/15 0900 08/19/15 0930 08/19/15 1000  BP: 119/71 125/78 147/69 123/67  Pulse: 94 100 100 96  Temp: 99.3 F (37.4 C)     TempSrc: Oral     Resp: 18     SpO2: 97% 100% 96% 98%    Meds given in ED:  Medications  sulfamethoxazole-trimethoprim (BACTRIM DS,SEPTRA DS) 800-160 MG per tablet 1 tablet (not administered)  sodium chloride 0.9 % bolus 1,000 mL (0 mLs Intravenous Stopped 08/19/15 1027)    New Prescriptions   SULFAMETHOXAZOLE-TRIMETHOPRIM (BACTRIM DS,SEPTRA DS) 800-160 MG TABLET    Take 1 tablet by mouth 2 (two) times daily.      Eber HongBrian Kayani Rapaport, MD 08/19/15 [phone]

## 2015-08-19 NOTE — Discharge Instructions (Signed)
Please obtain all of your results from medical records or have your doctors office obtain the results - share them with your doctor - you should be seen at your doctors office in the next 2 days. Call today to arrange your follow up. Take the medications as prescribed. Please review all of the medicines and only take them if you do not have an allergy to them. Please be aware that if you are taking birth control pills, taking other prescriptions, ESPECIALLY ANTIBIOTICS may make the birth control ineffective - if this is the case, either do not engage in sexual activity or use alternative methods of birth control such as condoms until you have finished the medicine and your family doctor says it is OK to restart them. If you are on a blood thinner such as COUMADIN, be aware that any other medicine that you take may cause the coumadin to either work too much, or not enough - you should have your coumadin level rechecked in next 7 days if this is the case.  °?  °It is also a possibility that you have an allergic reaction to any of the medicines that you have been prescribed - Everybody reacts differently to medications and while MOST people have no trouble with most medicines, you may have a reaction such as nausea, vomiting, rash, swelling, shortness of breath. If this is the case, please stop taking the medicine immediately and contact your physician.  °?  °You should return to the ER if you develop severe or worsening symptoms.  ° ° °RESOURCE GUIDE ° °Chronic Pain Problems: °Contact [facility]  [phone] °Patients need to be referred by their primary care doctor. ° °Insufficient Money for Medicine: °Contact [employer]:  call "211."  ° °No Primary Care Doctor: °- Call [facility]  [phone] - can help you locate a primary care doctor that  accepts your insurance, provides certain services, etc. °- Physician Referral Service- 1-800-533-3463 ° °Agencies that provide inexpensive medical  care: °- [facility]  [phone] °- [facility]  [phone] °- [facility]  [phone] °- [facility]  [phone] °- [facility]  [phone] °- [facility]  [phone] ° °Medicaid-accepting [place] Providers: °- [facility]- [address] ° [phone], Mon-Fri 9am-7pm, Sat 9am-1pm °- [facility]- [address], [address] ° [phone] °- [facility]- [address], [address] ° [phone] °- [facility]- [address] [place] [address] ° [phone] °- [facility]- [address], [address], [phone] ° Only accepts [place] [facility] Medicaid patients after they have their name  applied to their card ° °Self Pay (no insurance) in [place]: °- Sickle Cell Patients: Dr Eric Dean, [facility] ° [address], [phone] °- [facility]- [address] ° [phone] °      -     [facility]- [address] °      -     [facility]- see information above (Speak to Pam H if you do not have insurance) °      -  [facility] [place]- [address],  [phone] °      -  [facility]- [address] [place] [address], [phone] °      -  Dr Osei-Bonsu-  [address],   [place], [phone] °      -  [facility] - [address], [phone] °      -  [facility]- [address] [place] [address], [phone], also [address]  [address], [phone] °      -    [facility]- [address], [phone], 1st & 3rd Saturday °       every month, 10am-1pm ° °[facility] °[address] °[place], [place] [address] °([phone] ° °[facility] °[address] °[place], [place] [address] °([phone]) [phone] ° °1) Find a Doctor and Pay Out of Pocket °Although you won't have to find out who is covered by your insurance plan, it is a good idea  to ask around and get recommendations. You will then need to call the office and see if the doctor you have chosen will accept you as a new patient and what types of options they offer for patients who are self-pay. Some doctors offer discounts or will set up payment plans for their patients who do not have insurance, but you will need to ask so you aren't surprised when you get to your appointment. ° °2) Contact Your Local Health Department °Not all health departments have doctors that can see patients for sick visits, but many do, so it is worth a call to see if yours does. If you don't know where your local health department is, you can check in your phone book. The CDC also has a tool to help you locate your state's health department, and many state websites also have listings of all of their local health departments. ° °3) Find a Walk-in Clinic °If your illness is not likely to be very severe or complicated, you may want to try a walk in clinic. These are popping up all over the country in pharmacies, drugstores, and shopping centers. They're usually staffed by nurse practitioners or physician assistants that have been trained to treat common illnesses and complaints. They're usually fairly quick and inexpensive. However, if you have serious medical issues or chronic medical problems, these are probably not your best option ° °STD Testing °- [place] [facility] [place], [facility], [address], [place], phone [phone] or [phone].  Monday - Friday, call for an appointment. °- [place] [facility] [place], [facility], [address], [place], phone [phone] or [phone].  Monday - Friday, call for an appointment. ° °Abuse/Neglect: °- [place] Child Abuse Hotline (336) 641-3795 °- [place] Child Abuse Hotline 800-378-5315 (After Hours) ° °Emergency Shelter:  [facility] (336) 271-5985 ° °Maternity  Homes: °- [facility] (336) 275-9566 °- [facility] (704) 372-4663 ° °MRSA Hotline #:   832-7006 ° °Dental Assistance °If unable to pay or uninsured, contact:  [place] [facility] to become qualified for the adult dental clinic. ° °Patients with Medicaid: [facility] °[address], [phone] °[address], [phone] ° °If unable to pay, or uninsured, contact [place] [facility] ([phone] in [place], [phone] in [place]) to become qualified for the adult dental clinic ° °[facility] °[address] °[place], [place] [address] °(336) 272-4177 °[email or web address] ° °Other Low-Cost Community Dental Services: °- [facility]- [address], [place], [place], [address], [phone], Ext. 123, 2nd and 4th Thursday of the month at 6:30am.  10 clients   each day by appointment, can sometimes see walk-in patients if someone does not show for an appointment. °- [facility]- [address], [place], [place], [address], [phone] °- [facility]- [address], [place], [place], [address], [phone] °- [facility]- [phone] °- [facility]- [phone] °- [facility]- [phone] °-  °

## 2016-04-10 ENCOUNTER — Encounter (HOSPITAL_COMMUNITY): Payer: Self-pay | Admitting: *Deleted

## 2016-04-10 ENCOUNTER — Emergency Department (HOSPITAL_COMMUNITY)
Admission: EM | Admit: 2016-04-10 | Discharge: 2016-04-10 | Disposition: A | Discharge status: Home / Self Care | Payer: No Typology Code available for payment source | Attending: Emergency Medicine | Admitting: Emergency Medicine

## 2016-04-10 DIAGNOSIS — F17210 Nicotine dependence, cigarettes, uncomplicated: Secondary | ICD-10-CM | POA: Insufficient documentation

## 2016-04-10 DIAGNOSIS — K4090 Unilateral inguinal hernia, without obstruction or gangrene, not specified as recurrent: Secondary | ICD-10-CM

## 2016-04-10 DIAGNOSIS — R1110 Vomiting, unspecified: Secondary | ICD-10-CM | POA: Insufficient documentation

## 2016-04-10 NOTE — ED Provider Notes (Signed)
CSN: [id number]     Arrival date & time 04/10/16  4098 History  By signing my name below, I, Patrick Pierce, attest that this documentation has been prepared under the direction and in the presence of Melene Plan, DO. Electronically Signed: [name], ED Scribe. 04/10/2016. 3:59 AM.    Chief Complaint  Patient presents with  . Inguinal Hernia   The history is provided by the patient. No language interpreter was used.   HPI Comments: Patrick Pierce is a 30 y.o. male with a hx of hernia of testicle who presents to the Emergency Department complaining of gradually worsening moderate right suprapubic pain with scrotal swelling consistent with the pain of his known hernia onset 2 days ago. He reports that his hernia is incarcerated. He reports associated one episode of non-bloody vomiting. He states that the pain is worse with cough and movement. No alleviating factors noted. Pt has not tried any medications PTA. He states that he currently works with [employer]. He denies any constipation, fever, or chills.   Past Medical History  Diagnosis Date  . Hernia of testicle    History reviewed. No pertinent past surgical history. No family history on file. Social History  Substance Use Topics  . Smoking status: Current Every Day Smoker -- 0.50 packs/day    Types: Cigarettes  . Smokeless tobacco: Never Used  . Alcohol Use: No    Review of Systems  Constitutional: Negative for fever and chills.  HENT: Negative for congestion and facial swelling.   Eyes: Negative for discharge and visual disturbance.  Respiratory: Negative for shortness of breath.   Cardiovascular: Negative for chest pain and palpitations.  Gastrointestinal: Positive for vomiting and abdominal pain. Negative for diarrhea and constipation.  Genitourinary: Positive for scrotal swelling.  Musculoskeletal: Negative for myalgias and arthralgias.  Skin: Negative for color change and rash.   Neurological: Negative for tremors, syncope and headaches.  Psychiatric/Behavioral: Negative for confusion and dysphoric mood.  All other systems reviewed and are negative.     Allergies  Aspirin  Home Medications   Prior to Admission medications   Medication Sig Start Date End Date Taking? Authorizing Provider  ibuprofen (ADVIL,MOTRIN) 200 MG tablet Take 200 mg by mouth every 6 (six) hours as needed for fever.    Historical Provider, MD  Pheniramine-PE-APAP (THERAFLU FLU & SORE THROAT PO) Take 1 Package by mouth every 6 (six) hours as needed (cough/flu symptoms).    Historical Provider, MD   BP 128/67 mmHg  Pulse 71  Temp(Src) 98.1 F (36.7 C) (Oral)  Resp 18  Ht  (1.702 m)  Wt 228 lb (103.42 kg)  BMI 35.70 kg/m2  SpO2 98% Physical Exam  Constitutional: He is oriented to person, place, and time. He appears well-developed and well-nourished.  HENT:  Head: Normocephalic and atraumatic.  Eyes: EOM are normal. Pupils are equal, round, and reactive to light.  Neck: Normal range of motion. Neck supple. No JVD present.  Cardiovascular: Normal rate, regular rhythm, normal heart sounds and intact distal pulses.  Exam reveals no gallop and no friction rub.   No murmur heard. Pulmonary/Chest: Effort normal and breath sounds normal. No respiratory distress. He has no wheezes.  Abdominal: Soft. He exhibits no distension. There is no tenderness. There is no rebound and no guarding.  Genitourinary:  Swollen right scrotum, indirect inguinal hernia mildly TTP No duskiness to skin or erythema  Musculoskeletal: Normal range of motion.  Neurological: He is alert and oriented  to person, place, and time.  Skin: Skin is warm and dry. No rash noted. No pallor.  Psychiatric: He has a normal mood and affect. His behavior is normal. Judgment normal.  Nursing note and vitals reviewed.   ED Course  Hernia reduction Date/Time: 04/10/2016 4:05 AM Performed by: Adela LankFLOYD, Noa Galvao Authorized by:  Melene PlanFLOYD, Alexi Dorminey Consent: Verbal consent obtained. Risks and benefits: risks, benefits and alternatives were discussed Consent given by: patient Required items: required blood products, implants, devices, and special equipment available Patient identity confirmed: verbally with patient Time out: Immediately prior to procedure a "time out" was called to verify the correct patient, procedure, equipment, support staff and site/side marked as required. Local anesthesia used: no Patient sedated: no Patient tolerance: Patient tolerated the procedure well with no immediate complications   (including critical care time) DIAGNOSTIC STUDIES: Oxygen Saturation is 98% on RA, normal by my interpretation.    COORDINATION OF CARE: 3:58 AM- Pt advised of plan for treatment and pt agrees. [facility]   MDM   Final diagnoses:  None    30 yo M With a chief complaint of right inguinal pain. Patient has a history of a incarcerated hernia. Is never been reduced. Patient has been seen multiple times in the ED for the same. He has not been seen by a surgeon because he has been unable to afford the co-pay. The patient has never had pain with the area before but started a couple days ago. Patient appears to have 2 separate loops of bowel in his scrotum. One was reduced with complete resolution of his symptoms. We'll have him follow-up with general surgery  I personally performed the services described in this documentation, which was scribed in my presence. The recorded information has been reviewed and is accurate.   4:06 AM:  I have discussed the diagnosis/risks/treatment options with the patient and family and believe the pt to be eligible for discharge home to follow-up with Gen surgery. We also discussed returning to the ED immediately if new or worsening sx occur. We discussed the sx which are most concerning (e.g., sudden worsening pain, fever, inability to  tolerate by mouth ) that necessitate immediate return. Medications administered to the patient during their visit and any new prescriptions provided to the patient are listed below.  Medications given during this visit Medications - No data to display  New Prescriptions   No medications on file    The patient appears reasonably screen and/or stabilized for discharge and I doubt any other medical condition or other [facility] requiring further screening, evaluation, or treatment in the ED at this time prior to discharge.     Melene Planan Tanis Burnley, DO 04/10/16 [id number]

## 2016-04-10 NOTE — ED Notes (Signed)
Pt states that he has a known inguinal hernia; pt states that he has had increased pain over the last 2 days; pt states it hurts to cough or move

## 2016-04-10 NOTE — Discharge Instructions (Signed)

## 2016-08-03 ENCOUNTER — Encounter (HOSPITAL_COMMUNITY): Payer: Self-pay | Admitting: Emergency Medicine

## 2016-08-03 ENCOUNTER — Emergency Department (HOSPITAL_COMMUNITY)
Admission: EM | Admit: 2016-08-03 | Discharge: 2016-08-03 | Disposition: A | Discharge status: Home / Self Care | Payer: Self-pay | Attending: Emergency Medicine | Admitting: Emergency Medicine

## 2016-08-03 DIAGNOSIS — K4021 Bilateral inguinal hernia, without obstruction or gangrene, recurrent: Secondary | ICD-10-CM | POA: Insufficient documentation

## 2016-08-03 DIAGNOSIS — F17210 Nicotine dependence, cigarettes, uncomplicated: Secondary | ICD-10-CM | POA: Insufficient documentation

## 2016-08-03 NOTE — ED Triage Notes (Signed)
Pt sts right sided groin pain from hernia worse over last two weeks

## 2016-08-03 NOTE — ED Provider Notes (Signed)
[facility]-EMERGENCY DEPT Provider Note   CSN: [id number] Arrival date & time: 08/03/16  1454     History   Chief Complaint Chief Complaint  Patient presents with  . Groin Pain    HPI Patrick Pierce is a 30 y.o. male.  Pt presents to the ED today because of a hernia.  The pt has had his hernia for years.  He does not have insurance, so has been unable to get it repaired.  The pt said that he is here for a referral to general surgery as he is able to get a loan.  The pt does not want pain medications.       Past Medical History:  Diagnosis Date  . Hernia of testicle     Patient Active Problem List   Diagnosis Date Noted  . Right inguinal hernia 06/01/2012    History reviewed. No pertinent surgical history.     Home Medications    Prior to Admission medications   Medication Sig Start Date End Date Taking? Authorizing Provider  ibuprofen (ADVIL,MOTRIN) 200 MG tablet Take 200 mg by mouth every 6 (six) hours as needed for fever.    Historical Provider, MD  Pheniramine-PE-APAP (THERAFLU FLU & SORE THROAT PO) Take 1 Package by mouth every 6 (six) hours as needed (cough/flu symptoms).    Historical Provider, MD    Family History History reviewed. No pertinent family history.  Social History Social History  Substance Use Topics  . Smoking status: Current Every Day Smoker    Packs/day: 0.50    Types: Cigarettes  . Smokeless tobacco: Never Used  . Alcohol use No     Allergies   Aspirin   Review of Systems Review of Systems  Genitourinary: Positive for scrotal swelling.  All other systems reviewed and are negative.    Physical Exam Updated Vital Signs BP 140/75 (BP Location: Right Arm)   Pulse 73   Temp 98 F (36.7 C) (Oral)   Resp 18   SpO2 97%   Physical Exam  Constitutional: He appears well-developed and well-nourished.  HENT:  Head: Normocephalic and atraumatic.  Right Ear: External ear normal.  Left Ear: External ear normal.  Nose: Nose normal.    Mouth/Throat: Oropharynx is clear and moist.  Eyes: Conjunctivae and EOM are normal. Pupils are equal, round, and reactive to light.  Neck: Normal range of motion. Neck supple.  Cardiovascular: Normal rate, regular rhythm, normal heart sounds and intact distal pulses.   Pulmonary/Chest: Effort normal and breath sounds normal.  Abdominal: Soft. Bowel sounds are normal.  Genitourinary:  Genitourinary Comments: Very large inguinal hernia (cantelope size).  I am not sure if it is all right or bilateral.  I am unable to reduce it.  Pt said it's been like that for years.   Nursing note and vitals reviewed.    ED Treatments / Results  Labs (all labs ordered are listed, but only abnormal results are displayed) Labs Reviewed - No data to display  EKG  EKG Interpretation None       Radiology No results found.  Procedures Procedures (including critical care time)  Medications Ordered in ED Medications - No data to display   Initial Impression / Assessment and Plan / ED Course  I have reviewed the triage vital signs and the nursing notes.  Pertinent labs & imaging results that were available during my care of the patient were reviewed by me and considered in my medical decision making (see chart for details).  Clinical Course    Pt given the number for general surgery to f/u.  He knows to return if worse.  Final Clinical Impressions(s) / ED Diagnoses   Final diagnoses:  Bilateral recurrent inguinal hernia without obstruction or gangrene    New Prescriptions New Prescriptions   No medications on file     Jacalyn LefevreJulie Mekala Winger, MD 08/03/16 1610

## 2016-08-03 NOTE — ED Notes (Signed)
Pt is in stable condition upon d/c and ambulates from ED. 

## 2016-09-22 ENCOUNTER — Ambulatory Visit (HOSPITAL_COMMUNITY)
Admission: EM | Admit: 2016-09-22 | Discharge: 2016-09-22 | Disposition: A | Discharge status: Nursing Home (Includes ICF) | Payer: Self-pay

## 2016-09-22 ENCOUNTER — Encounter (HOSPITAL_COMMUNITY): Payer: Self-pay | Admitting: Emergency Medicine

## 2016-09-22 ENCOUNTER — Emergency Department (HOSPITAL_COMMUNITY)
Admission: EM | Admit: 2016-09-22 | Discharge: 2016-09-22 | Disposition: A | Discharge status: Home / Self Care | Payer: Self-pay | Attending: Emergency Medicine | Admitting: Emergency Medicine

## 2016-09-22 ENCOUNTER — Encounter (HOSPITAL_COMMUNITY): Payer: Self-pay | Admitting: Neurology

## 2016-09-22 DIAGNOSIS — K4000 Bilateral inguinal hernia, with obstruction, without gangrene, not specified as recurrent: Secondary | ICD-10-CM

## 2016-09-22 DIAGNOSIS — K4090 Unilateral inguinal hernia, without obstruction or gangrene, not specified as recurrent: Secondary | ICD-10-CM | POA: Insufficient documentation

## 2016-09-22 DIAGNOSIS — F17210 Nicotine dependence, cigarettes, uncomplicated: Secondary | ICD-10-CM | POA: Insufficient documentation

## 2016-09-22 LAB — I-STAT CG4 LACTIC ACID, ED: LACTIC ACID, VENOUS: 1.14 mmol/L (ref 0.5–1.9)

## 2016-09-22 LAB — CBC WITH DIFFERENTIAL/PLATELET
BASOS ABS: 0 10*3/uL (ref 0.0–0.1)
BASOS PCT: 1 %
EOS ABS: 0.3 10*3/uL (ref 0.0–0.7)
EOS PCT: 4 %
HEMATOCRIT: 43.1 % (ref 39.0–52.0)
Hemoglobin: 14.5 g/dL (ref 13.0–17.0)
Lymphocytes Relative: 54 %
Lymphs Abs: 4.2 10*3/uL — ABNORMAL HIGH (ref 0.7–4.0)
MCH: 31.2 pg (ref 26.0–34.0)
MCHC: 33.6 g/dL (ref 30.0–36.0)
MCV: 92.7 fL (ref 78.0–100.0)
Monocytes Absolute: 0.5 10*3/uL (ref 0.1–1.0)
Monocytes Relative: 6 %
NEUTROS ABS: 2.8 10*3/uL (ref 1.7–7.7)
Neutrophils Relative %: 35 %
PLATELETS: 285 10*3/uL (ref 150–400)
RBC: 4.65 MIL/uL (ref 4.22–5.81)
RDW: 13.6 % (ref 11.5–15.5)
WBC: 7.9 10*3/uL (ref 4.0–10.5)

## 2016-09-22 NOTE — ED Provider Notes (Signed)
[facility]    CSN: [id number] Arrival date & time: 09/22/16  1020     History   Chief Complaint Chief Complaint  Patient presents with  . Hernia    HPI Patrick Pierce is a 31 y.o. male.   The history is provided by the patient.  Male GU Problem  Presenting symptoms: scrotal pain and swelling   Relieved by:  Nothing Worsened by:  Tactile pressure and activity Ineffective treatments:  Elevation of scrotum Associated symptoms: abdominal pain, groin pain and scrotal swelling     Past Medical History:  Diagnosis Date  . Hernia of testicle     Patient Active Problem List   Diagnosis Date Noted  . Right inguinal hernia 06/01/2012    History reviewed. No pertinent surgical history.     Home Medications    Prior to Admission medications   Not on File    Family History History reviewed. No pertinent family history.  Social History Social History  Substance Use Topics  . Smoking status: Current Every Day Smoker    Packs/day: 0.50    Types: Cigarettes  . Smokeless tobacco: Never Used  . Alcohol use No     Allergies   Aspirin   Review of Systems Review of Systems  Constitutional: Positive for activity change.  Gastrointestinal: Positive for abdominal pain.  Genitourinary: Positive for scrotal swelling and testicular pain.  All other systems reviewed and are negative.    Physical Exam Triage Vital Signs ED Triage Vitals  Enc Vitals Group     BP 09/22/16 1119 160/100     Pulse Rate 09/22/16 1119 80     Resp 09/22/16 1119 18     Temp 09/22/16 1119 98.1 F (36.7 C)     Temp Source 09/22/16 1119 Oral     SpO2 09/22/16 1119 98 %     Weight --      Height --      Head Circumference --      Peak Flow --      Pain Score 09/22/16 1123 0     Pain Loc --      Pain Edu? --      Excl. in GC? --    No data found.   Updated Vital Signs BP 160/100 (BP Location: Left Wrist)   Pulse 80   Temp 98.1 F (36.7 C) (Oral)   Resp 18   SpO2 98%     Visual Acuity Right Eye Distance:   Left Eye Distance:   Bilateral Distance:    Right Eye Near:   Left Eye Near:    Bilateral Near:     Physical Exam  Constitutional: He appears well-developed and well-nourished. He appears distressed.  Abdominal: A hernia is present. Hernia confirmed positive in the right inguinal area and confirmed positive in the left inguinal area.  Genitourinary: Right testis shows swelling and tenderness. Left testis shows swelling and tenderness.        UC Treatments / Results  Labs (all labs ordered are listed, but only abnormal results are displayed) Labs Reviewed - No data to display  EKG  EKG Interpretation None       Radiology No results found.  Procedures Procedures (including critical care time)  Medications Ordered in UC Medications - No data to display   Initial Impression / Assessment and Plan / UC Course  I have reviewed the triage vital signs and the nursing notes.  Pertinent labs & imaging results that were available  during my care of the patient were reviewed by me and considered in my medical decision making (see chart for details).  Clinical Course     Sent for gen surgical eval of bilat inc inguinal hernia.  Final Clinical Impressions(s) / UC Diagnoses   Final diagnoses:  None    New Prescriptions New Prescriptions   No medications on file     Linna HoffJames D Alby Schwabe, MD 09/22/16 1204

## 2016-09-22 NOTE — ED Triage Notes (Signed)
Pt here c/o hernia to right groin that he has had for 2 years, but has been "out" for 1 year. Coming from urgent care where he presented for a work note, sent down here to have surgery evaluate. No pain now. Denies any problems urinating or having BM.

## 2016-09-22 NOTE — ED Triage Notes (Signed)
[facility] with a complaint of a hernia in his groin area. The patient reported that he has been diagnosed with a hernia but can not afford surgery. He stated that he is here today for a work note so that he can continue to work.

## 2016-09-22 NOTE — ED Provider Notes (Signed)
[facility]   CSN: [id number] Arrival date & time: 09/22/16  1232     History   Chief Complaint Chief Complaint  Patient presents with  . Hernia    HPI Patrick Pierce is a 31 y.o. male.  The history is provided by the patient and medical records. No language interpreter was used.   Patient presents today after being referred from urgent care for scrotal hernia. Patient states he has had this hernia for more than 3 years but over the last year it is been persistently out. States that his job had recommended he get a doctor's note to be cleared for work. Went to urgent care and after seeing the extent of hernia the patient had they sent him here for further evaluation. Patient denies any pain at the site. He does state that at times he gets pain in his epigastrium when he coughs. Has not had any nausea, vomiting, diarrhea, chest pain, shortness of breath. Denies any prior surgery related to the hernia. No recent fevers or chills. Has had normal bowel movements and last bowel movement was this morning. States that a year ago he is able to push it back in but is no longer able to do so.   Past Medical History:  Diagnosis Date  . Hernia of testicle     Patient Active Problem List   Diagnosis Date Noted  . Right inguinal hernia 06/01/2012    History reviewed. No pertinent surgical history.     Home Medications    Prior to Admission medications   Not on File    Family History No family history on file.  Social History Social History  Substance Use Topics  . Smoking status: Current Every Day Smoker    Packs/day: 0.50    Types: Cigarettes  . Smokeless tobacco: Never Used  . Alcohol use No     Allergies   Aspirin   Review of Systems Review of Systems  Constitutional: Negative for chills and fever.  HENT: Negative for ear pain and sore throat.   Respiratory: Negative for cough and shortness of breath.   Cardiovascular: Negative for chest pain  and palpitations.  Gastrointestinal: Positive for abdominal pain (epigastric when coughing). Negative for vomiting.  Genitourinary: Positive for scrotal swelling (due to extent of hernia). Negative for dysuria and hematuria.  Musculoskeletal: Negative for arthralgias and back pain.  Skin: Negative for color change and rash.  Neurological: Negative for seizures and syncope.  All other systems reviewed and are negative.    Physical Exam Updated Vital Signs BP 99/86   Pulse 74   Temp 98.3 F (36.8 C) (Oral)   Resp 17   Ht 5\' 6"  (1.676 m)   Wt 108.9 kg   SpO2 98%   BMI 38.74 kg/m   Physical Exam  Constitutional: He appears well-developed and well-nourished. No distress.  HENT:  Head: Normocephalic and atraumatic.  Eyes: Conjunctivae are normal.  Neck: Neck supple.  Cardiovascular: Normal rate and regular rhythm.   No murmur heard. Pulmonary/Chest: Effort normal and breath sounds normal. No respiratory distress.  Abdominal: Soft. Bowel sounds are normal. There is no tenderness.  Genitourinary: Penis normal.     Musculoskeletal: He exhibits no edema.  Neurological: He is alert.  Skin: Skin is warm and dry. He is not diaphoretic.  Psychiatric: He has a normal mood and affect.  Nursing note and vitals reviewed.    ED Treatments / Results  Labs (all labs ordered are listed, but  only abnormal results are displayed) Labs Reviewed  CBC WITH DIFFERENTIAL/PLATELET - Abnormal; Notable for the following:       Result Value   Lymphs Abs 4.2 (*)    All other components within normal limits  I-STAT CG4 LACTIC ACID, ED    EKG  EKG Interpretation None       Radiology No results found.  Procedures Hernia reduction Date/Time: 09/22/2016 8:23 PM Performed by: Madolyn FriezeNAGPAL, Syann Cupples Authorized by: Lyndal PulleyKNOTT, DANIEL  Consent: Verbal consent obtained. Consent given by: patient Patient understanding: patient states understanding of the procedure being performed Patient identity  confirmed: verbally with patient Local anesthesia used: no  Anesthesia: Local anesthesia used: no  Sedation: Patient sedated: no Patient tolerance: Patient tolerated the procedure well with no immediate complications Comments: Easily reducible scrotal hernia with constant pressure. Testicles palpated in scrotum following successful reduction.     (including critical care time)  Medications Ordered in ED Medications - No data to display   Initial Impression / Assessment and Plan / ED Course  I have reviewed the triage vital signs and the nursing notes.  Pertinent labs & imaging results that were available during my care of the patient were reviewed by me and considered in my medical decision making (see chart for details).  Clinical Course     Patient is a 31 year old male who is here by urgent care due to concern for scrotal hernia. On exam the patient does have a very large scrotal hernia. It is soft and compressible. No overlying erythema or exquisite tenderness. Lactic acid and white blood cell count are within normal limits. I highly doubt strangulation given his clinical presentation.  We were able to reduce hernia as noted above. Patient tolerated this well. He was given resources for follow-up with general surgeon. Patient cleared for return to work. He was discharged home in good condition.  Final Clinical Impressions(s) / ED Diagnoses   Final diagnoses:  Right inguinal hernia    New Prescriptions There are no discharge medications for this patient.    Madolyn FriezeVijay Talin Feister, MD 09/23/16 [id number]    Lyndal Pulleyaniel Knott, MD 09/23/16 0330

## 2016-11-18 ENCOUNTER — Ambulatory Visit (INDEPENDENT_AMBULATORY_CARE_PROVIDER_SITE_OTHER): Payer: Self-pay | Admitting: Internal Medicine

## 2016-11-18 ENCOUNTER — Encounter: Payer: Self-pay | Admitting: Internal Medicine

## 2016-11-18 VITALS — BP 112/82 | HR 80 | Resp 12 | Ht 67.25 in | Wt 248.0 lb

## 2016-11-18 DIAGNOSIS — Z720 Tobacco use: Secondary | ICD-10-CM

## 2016-11-18 DIAGNOSIS — K4020 Bilateral inguinal hernia, without obstruction or gangrene, not specified as recurrent: Secondary | ICD-10-CM

## 2016-11-18 NOTE — Patient Instructions (Signed)
Tobacco Cessation:   [id number] or [phone], the former for support and possibly free nicotine patches/gum and support; the latter for [facility] Smoking cessation class. Get rid of all smoking supplies:  Cigarettes, lighters, ashtrays--no stashes just in case at home if you are serious.  For nicotine patches:  Stop smoking anything the day you start the first patch Start with 21 mg patch and reapply new to different area of skin every 24 hours for 30 days. Then 14 mg patch changed every 24 hours for 14 days. Then 7 mg patch changed every 24 hours for 14 days.  

## 2016-11-18 NOTE — Progress Notes (Signed)
   Subjective:    Patient ID: Patrick Pierce, male    DOB: [birth year], 31 y.o.   MRN: [id number]  HPI   Here to establish  Feels he has hernias on both sides.  Has had for 2 years, but in past 6 months has worsened and now getting painful in past 2-3 weeks.  Can reduce when recumbent, but comes right back out.  Hurts if coughs or talks too loudly.   Has gained about 60 lbs in past 2 years.  Stress eating.  No outpatient prescriptions have been marked as taking for the 11/18/16 encounter (Office Visit) with Julieanne MansonElizabeth Signa Cheek, MD.    Allergies  Allergen Reactions  . Aspirin Anaphylaxis    Swelling under tongue    Past Medical History:  Diagnosis Date  . Hernia of testicle     History reviewed. No pertinent surgical history.   Family History  Problem Relation Age of Onset  . HIV/AIDS Mother   . Alcohol abuse Mother   . Congenital heart disease Son     Social History   Social History  . Marital status: Single    Spouse name: N/A  . Number of children: N/A  . Years of education: N/A   Occupational History  . Not on file.   Social History Main Topics  . Smoking status: Current Every Day Smoker    Packs/day: 1.00    Years: 15.00    Types: Cigarettes  . Smokeless tobacco: Never Used  . Alcohol use No  . Drug use: No  . Sexual activity: Not on file   Other Topics Concern  . Not on file   Social History Narrative  . No narrative on file           Review of Systems     Objective:   Physical Exam NAD Lungs:  CTA CV:  RRR with normal S1 and S2, No S3, S4 or murmur, Radial and DP pulses normal and equal Abd:  S, NT, No HSM or mass, + BS GU:  Extremely large mass of what appears to be herniated intestine into scrotum bilaterally with loss of midline separation of scrotum, testicles are pushed anteriorlaterally over this mass.  With patient recumbent, difficult to even see where penis is buried in the mass.  On standing, becomes more prominent at upper area of  mass.  Able to palpate a large inguinal ring on the right. Unable to adequately palpate inguinal ring on left. With patient recumbent, unable to reduce this hernia.  No discoloration of tissue.  Herniated mass is soft to palpation.        Assessment & Plan:  Significant bilateral inguinal hernias with inability to reduce today.  This has to be causing significant pain and disability in moving about for this young man.  Urgent referral to General surgery to repair. Discussed if he develops increasing pain, swelling, firmness of hernia tissue to go to ED immediately  Tobacco use:  Discussed using patches to quit prior to surgery

## 2016-12-26 ENCOUNTER — Encounter (HOSPITAL_COMMUNITY): Payer: Self-pay | Admitting: *Deleted

## 2016-12-26 ENCOUNTER — Emergency Department (HOSPITAL_COMMUNITY)
Admission: EM | Admit: 2016-12-26 | Discharge: 2016-12-27 | Disposition: A | Discharge status: Home / Self Care | Payer: Self-pay | Attending: Emergency Medicine | Admitting: Emergency Medicine

## 2016-12-26 ENCOUNTER — Emergency Department (HOSPITAL_COMMUNITY): Payer: Self-pay

## 2016-12-26 DIAGNOSIS — G8918 Other acute postprocedural pain: Secondary | ICD-10-CM | POA: Insufficient documentation

## 2016-12-26 DIAGNOSIS — R1031 Right lower quadrant pain: Secondary | ICD-10-CM | POA: Insufficient documentation

## 2016-12-26 DIAGNOSIS — S3022XA Contusion of scrotum and testes, initial encounter: Secondary | ICD-10-CM

## 2016-12-26 LAB — URINALYSIS, ROUTINE W REFLEX MICROSCOPIC
BILIRUBIN URINE: NEGATIVE
GLUCOSE, UA: NEGATIVE mg/dL
HGB URINE DIPSTICK: NEGATIVE
KETONES UR: NEGATIVE mg/dL
LEUKOCYTES UA: NEGATIVE
Nitrite: NEGATIVE
PH: 5 (ref 5.0–8.0)
PROTEIN: NEGATIVE mg/dL
Specific Gravity, Urine: 1.027 (ref 1.005–1.030)

## 2016-12-26 LAB — CBC WITH DIFFERENTIAL/PLATELET
BASOS ABS: 0 10*3/uL (ref 0.0–0.1)
Basophils Relative: 0 %
EOS ABS: 0.4 10*3/uL (ref 0.0–0.7)
EOS PCT: 3 %
HCT: 38 % — ABNORMAL LOW (ref 39.0–52.0)
Hemoglobin: 12.7 g/dL — ABNORMAL LOW (ref 13.0–17.0)
Lymphocytes Relative: 37 %
Lymphs Abs: 3.9 10*3/uL (ref 0.7–4.0)
MCH: 30.5 pg (ref 26.0–34.0)
MCHC: 33.4 g/dL (ref 30.0–36.0)
MCV: 91.3 fL (ref 78.0–100.0)
MONO ABS: 0.5 10*3/uL (ref 0.1–1.0)
Monocytes Relative: 5 %
Neutro Abs: 5.8 10*3/uL (ref 1.7–7.7)
Neutrophils Relative %: 55 %
PLATELETS: 430 10*3/uL — AB (ref 150–400)
RBC: 4.16 MIL/uL — AB (ref 4.22–5.81)
RDW: 13.3 % (ref 11.5–15.5)
WBC: 10.5 10*3/uL (ref 4.0–10.5)

## 2016-12-26 LAB — I-STAT CHEM 8, ED
BUN: 12 mg/dL (ref 6–20)
CREATININE: 0.9 mg/dL (ref 0.61–1.24)
Calcium, Ion: 1.08 mmol/L — ABNORMAL LOW (ref 1.15–1.40)
Chloride: 107 mmol/L (ref 101–111)
GLUCOSE: 123 mg/dL — AB (ref 65–99)
HCT: 39 % (ref 39.0–52.0)
HEMOGLOBIN: 13.3 g/dL (ref 13.0–17.0)
Potassium: 3.7 mmol/L (ref 3.5–5.1)
Sodium: 141 mmol/L (ref 135–145)
TCO2: 25 mmol/L (ref 0–100)

## 2016-12-26 MED ORDER — IOPAMIDOL (ISOVUE-300) INJECTION 61%
INTRAVENOUS | Status: AC
  Administered 2016-12-26: 100 mL via INTRAVENOUS
  Filled 2016-12-26: qty 100

## 2016-12-26 MED ORDER — MORPHINE SULFATE (PF) 2 MG/ML IV SOLN
4.0000 mg | Freq: Once | INTRAVENOUS | Status: AC
  Administered 2016-12-26: 4 mg via INTRAVENOUS
  Filled 2016-12-26: qty 2

## 2016-12-26 MED ORDER — ONDANSETRON HCL 4 MG/2ML IJ SOLN
4.0000 mg | Freq: Once | INTRAMUSCULAR | Status: AC
  Administered 2016-12-26: 4 mg via INTRAVENOUS
  Filled 2016-12-26: qty 2

## 2016-12-26 NOTE — Discharge Instructions (Signed)
Place an ice pack on the swollen area 4 times daily for 30 minutes at a time. Take Tylenol every 4 hours as needed for pain. Go to the surgical clinic at [facility] Monday, 12/28/2016 between 9 AM and noon. Take the copy of the CT scan with you that your given tonight

## 2016-12-26 NOTE — ED Provider Notes (Addendum)
Patient had elective inguinal hernia surgery repair pair right side at [facility] 12/18/2015. He reports for the past 2 days his scrotum has been swollen. It feels like the hernia has come back. He has pain at the incision site at his right inguinal area and right sides swollen scrotum. It is worse with standing. Improved with scrotal support by his hand He denies fever denies vomiting denies abdominal pain denies change in bowel habits. On exam he is alert and in no distress abdomen soft nontender. At right inguinal area there is a well-healed surgical wound with which is slightly swollen and tender. Scrotum is grossly swollen right side   Doug Sou, MD 12/26/16 2028 Spoke with Dr.Zocher from [facility] surgical service. Plan ice, Tylenol. Patient to go to surgical clinic Monday, 12/28/2016 between 9 AM and noon for reevaluation. Tylenol for pain Dx scrotal hematoma   Doug Sou, MD 12/26/16 2338

## 2016-12-26 NOTE — ED Provider Notes (Signed)
[facility] Note   CSN: [id number] Arrival date & time: 12/26/16  1829     History   Chief Complaint Chief Complaint  Patient presents with  . Groin Pain  . Back Pain    HPI Patrick Pierce is a 31 y.o. male.  HPI   Pt with hx bilateral inguinal hernias s/p repair right inguinal hernia 12/17/16 p/w persistent swelling in the right scrotum, incisional pain, occasional RLQ abdominal pain with urination, and constant cramping low back pain, worse with movement.  Was having good symptoms control with percocet but has run out.  No relief with tylenol.  Denies fevers, vomiting, dysuria.  Is having normal bowel movements.  Does have chronic cough from smoking but denies increase in cough or any SOB or CP.   Past Medical History:  Diagnosis Date  . Hernia of testicle     Patient Active Problem List   Diagnosis Date Noted  . Right inguinal hernia 06/01/2012    History reviewed. No pertinent surgical history.     Home Medications    Prior to Admission medications   Medication Sig Start Date End Date Taking? Authorizing Provider  acetaminophen (TYLENOL) 650 MG CR tablet Take 1,300 mg by mouth every 8 (eight) hours as needed for pain.   Yes Historical Provider, MD    Family History Family History  Problem Relation Age of Onset  . HIV/AIDS Mother   . Alcohol abuse Mother   . Congenital heart disease Son     Social History Social History  Substance Use Topics  . Smoking status: Current Every Day Smoker    Packs/day: 1.00    Years: 15.00    Types: Cigarettes  . Smokeless tobacco: Never Used  . Alcohol use No     Allergies   Aspirin   Review of Systems Review of Systems  All other systems reviewed and are negative.    Physical Exam Updated Vital Signs BP 131/64 (BP Location: Left Arm)   Pulse 91   Temp 98.3 F (36.8 C) (Oral)   Resp 18   Wt 112.5 kg   SpO2 97%   BMI 38.55 kg/m   Physical Exam  Constitutional: He appears well-developed  and well-nourished. No distress.  HENT:  Head: Normocephalic and atraumatic.  Neck: Neck supple.  Cardiovascular: Normal rate and regular rhythm.   Pulmonary/Chest: Effort normal and breath sounds normal. No respiratory distress. He has no wheezes. He has no rales.  Abdominal: Soft. He exhibits no distension and no mass. There is no tenderness. There is no rebound and no guarding.  Genitourinary:  Genitourinary Comments: Enlarged scrotum with large palpable hernia on right.   Right groin with surgical incision without erythema, edema, warmth, or discharge.  Suture line is intact.    Neurological: He is alert. He exhibits normal muscle tone.  Skin: He is not diaphoretic.  Nursing note and vitals reviewed.    ED Treatments / Results  Labs (all labs ordered are listed, but only abnormal results are displayed) Labs Reviewed  URINALYSIS, ROUTINE W REFLEX MICROSCOPIC  CBC WITH DIFFERENTIAL/PLATELET  I-STAT CHEM 8, ED    EKG  EKG Interpretation None       Radiology No results found.  Procedures Procedures (including critical care time)  Medications Ordered in ED Medications  morphine 2 MG/ML injection 4 mg (4 mg Intravenous Given 12/26/16 2159)  ondansetron (ZOFRAN) injection 4 mg (4 mg Intravenous Given 12/26/16 2159)     Initial Impression / Assessment and  Plan / ED Course  I have reviewed the triage vital signs and the nursing notes.  Pertinent labs & imaging results that were available during my care of the patient were reviewed by me and considered in my medical decision making (see chart for details).  Clinical Course as of Dec 26 2199  Sat Dec 26, 2016  2020 Discussed pt with Dr Ethelda Chick who will also see and examine the patient.    [EW]    Clinical Course User Index [EW] Trixie Dredge, PA-C    Pt with recent surgery on right inguinal hernia p/w right scrotal fullness, RLQ abdominal pain, right back pain.  Pending labs, CT abd/pelvis at change of shift.  Dr  Ethelda Chick assumes care of patient.    Final Clinical Impressions(s) / ED Diagnoses   Final diagnoses:  Postoperative pain    New Prescriptions New Prescriptions   No medications on file     Trixie Dredge, PA-C 12/26/16 2201    Doug Sou, MD 12/27/16 [phone]

## 2016-12-26 NOTE — ED Notes (Signed)
Unable to collect labs nurse is going to start IV and collect labs

## 2016-12-26 NOTE — ED Triage Notes (Signed)
Pt complains of groin pain since hernia surgery on 3/29. Pt states he has another hernia that needs repair that was causing pain. Pt also complains of back pain, which began 2 days after surgery.

## 2017-01-05 ENCOUNTER — Other Ambulatory Visit: Payer: Self-pay

## 2017-12-14 IMAGING — CT CT ABD-PELV W/ CM
2 of 4 series · 15 of 46 positions shown, 17 images · IV contrast (ISOVUE)
Comparison: None.

CLINICAL DATA: Groin pain and swelling after herniorrhaphy on
12/17/2016.

EXAM:
CT ABDOMEN AND PELVIS WITH CONTRAST
TECHNIQUE: Multidetector CT imaging of the abdomen and pelvis was performed
using the standard protocol following bolus administration of
intravenous contrast.
CONTRAST:  100ml CJJAZM-6UU IOPAMIDOL (CJJAZM-6UU) INJECTION 61%

[Series 2: abd/pel with · axial · 0.84mm/px · z∈[+1134,+1629]mm · 12 of 113 slices shown, 14 images]
[im 7/113  soft-tissue]
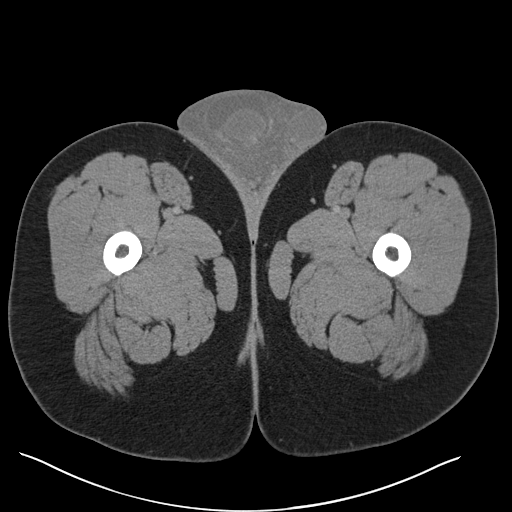
[im 7/113  bone]
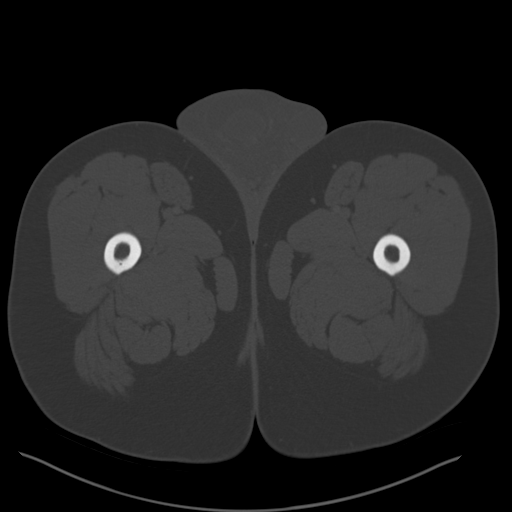
[im 20/113  soft-tissue]
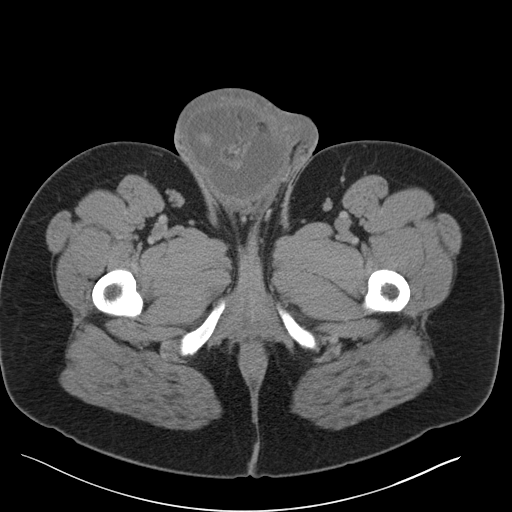
[im 27/113  soft-tissue]
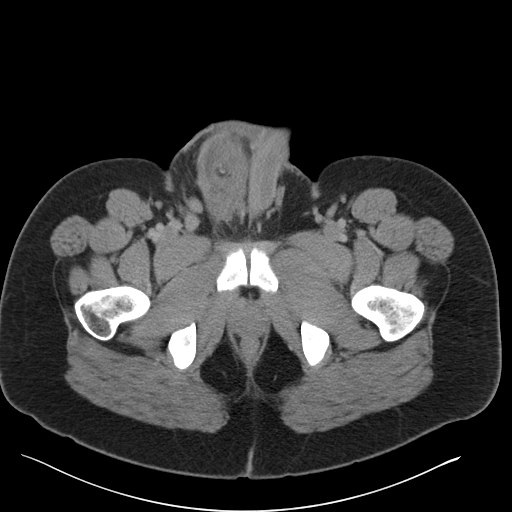
[im 33/113  soft-tissue]
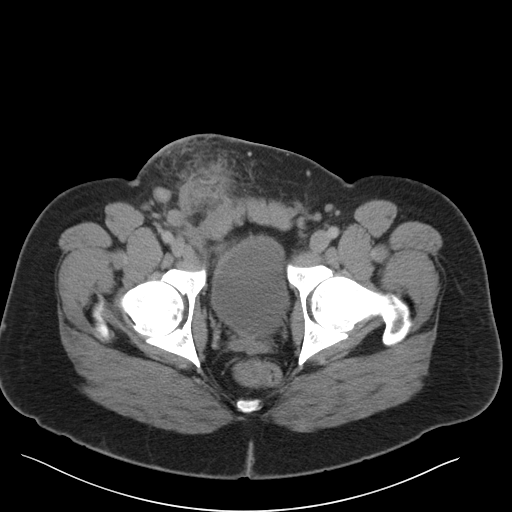
[im 47/113  soft-tissue]
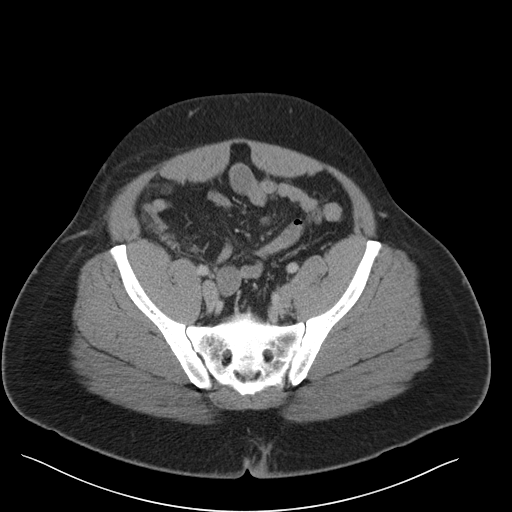
[im 53/113  soft-tissue]
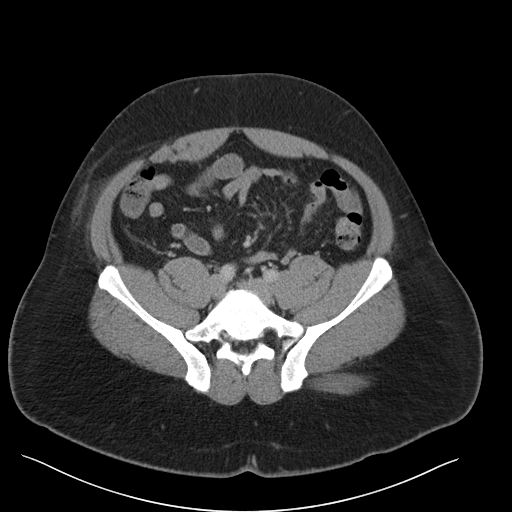
[im 60/113  soft-tissue]
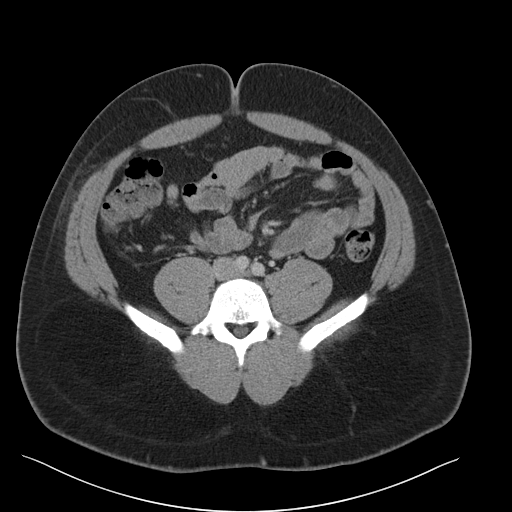
[im 73/113  soft-tissue]
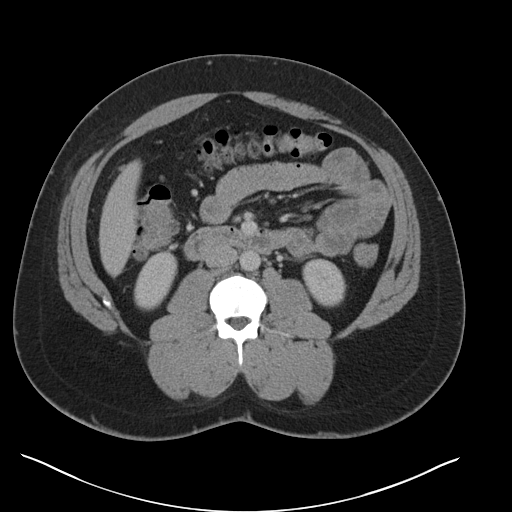
[im 80/113  soft-tissue]
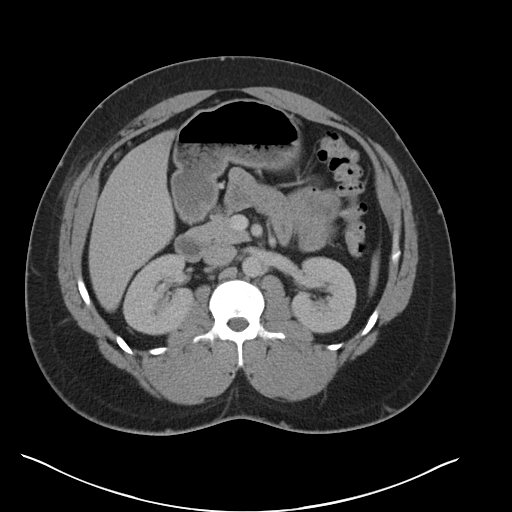
[im 80/113  bone]
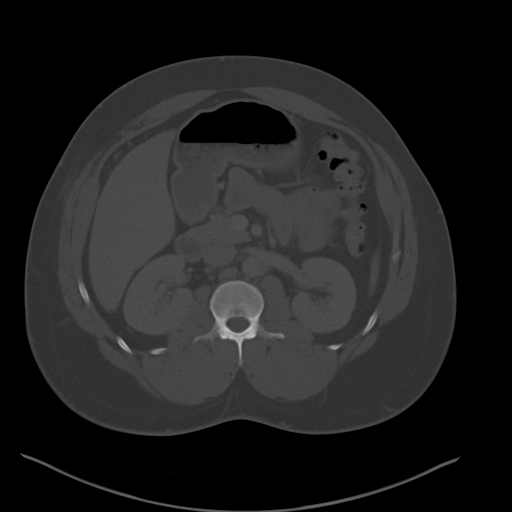
[im 86/113  soft-tissue]
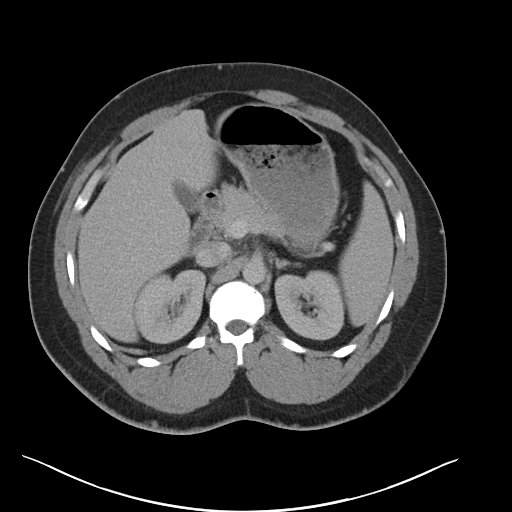
[im 99/113  soft-tissue]
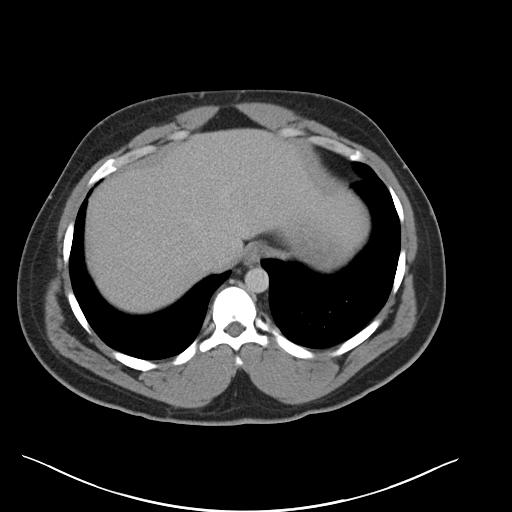
[im 106/113  soft-tissue]
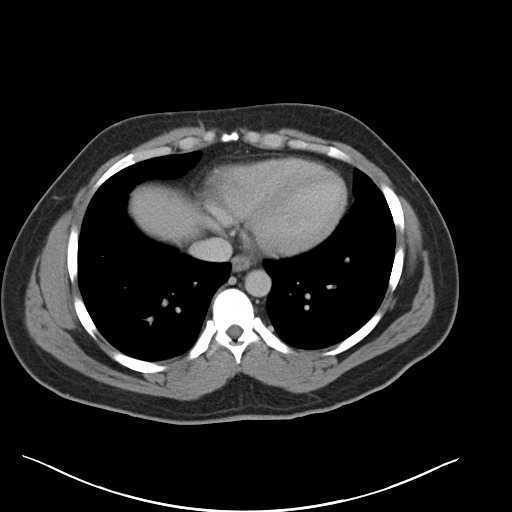

[Series 3: coronal a/|p · coronal · 0.74mm/px · 3 of 191 slices shown]
[im 64/191  soft-tissue]
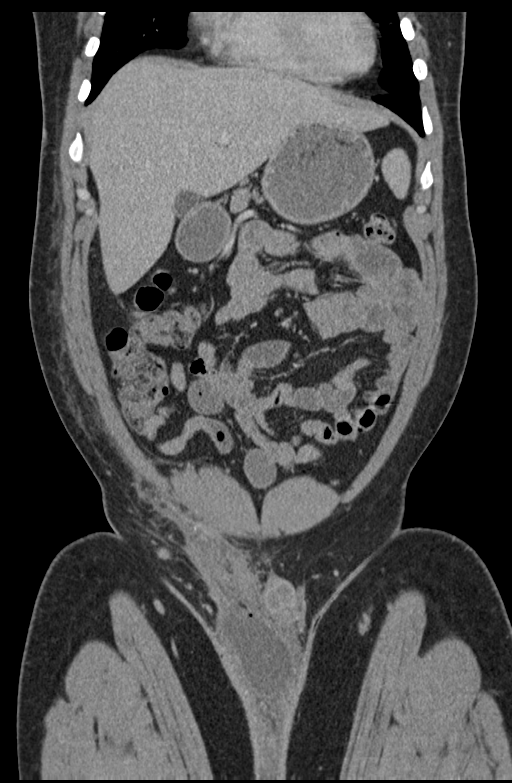
[im 85/191  soft-tissue]
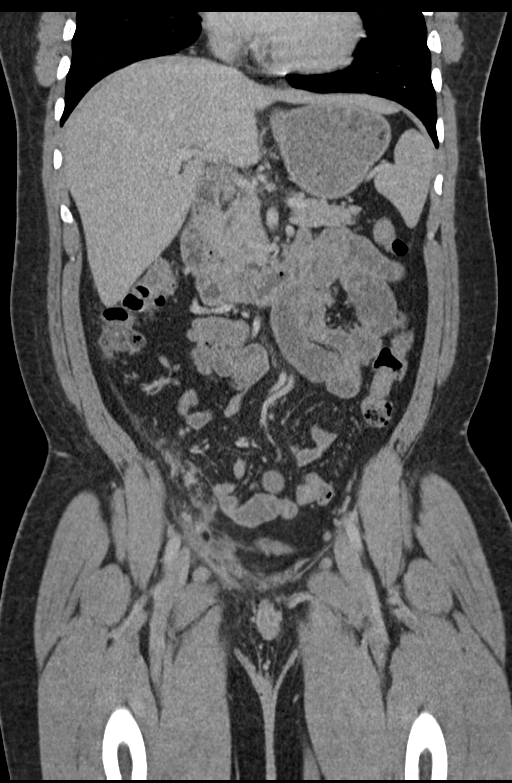
[im 106/191  soft-tissue]
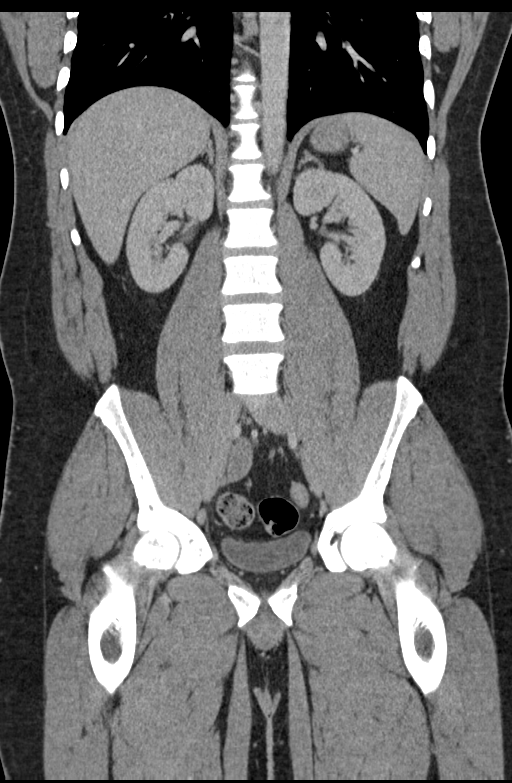

[15 of 46 positions shown; findings below may reference images not displayed]

FINDINGS: Lower chest: No acute abnormality.

Hepatobiliary: No focal liver abnormality is seen. No gallstones,
gallbladder wall thickening, or biliary dilatation.

Pancreas: Unremarkable. No pancreatic ductal dilatation or
surrounding inflammatory changes.

Spleen: Normal in size without focal abnormality.

Adrenals/Urinary Tract: Adrenal glands are unremarkable. Kidneys are
normal, without renal calculi, focal lesion, or hydronephrosis.
Bladder is unremarkable.

Stomach/Bowel: Stomach is within normal limits. Appendix is normal.
No evidence of bowel wall thickening, distention, or inflammatory
changes.

Vascular/Lymphatic: No significant vascular findings are present.
Mild reactive appearing inguinal adenopathy, right greater than
left. No other enlarged abdominal or pelvic lymph nodes.

Reproductive: Complex right scrotal collection, likely hematoma. The
collection and adjacent edema track upward along the spermatic cord.
No collections deep to the abdominal wall musculature although a
small amount of stranding edema continues a short distance into the
extraperitoneal right lower quadrant. No soft tissue gas

Other: No ascites.

Musculoskeletal: No significant skeletal lesion.
IMPRESSION: 1. Complex right scrotal collection, probably hematoma, measuring
over 9 by 15 cm. The collection and adjacent inflammation track
upward along the spermatic cord through the inguinal canal.
2. Mildly prominent inguinal nodes, right greater than left, likely
reactive.
3. No other significant abnormality.

## 2019-06-02 ENCOUNTER — Encounter (HOSPITAL_COMMUNITY): Payer: Self-pay | Admitting: Emergency Medicine

## 2019-06-02 ENCOUNTER — Emergency Department (HOSPITAL_COMMUNITY)
Admission: EM | Admit: 2019-06-02 | Discharge: 2019-06-02 | Disposition: A | Discharge status: Home / Self Care | Payer: Self-pay | Attending: Emergency Medicine | Admitting: Emergency Medicine

## 2019-06-02 ENCOUNTER — Other Ambulatory Visit: Payer: Self-pay

## 2019-06-02 DIAGNOSIS — F17210 Nicotine dependence, cigarettes, uncomplicated: Secondary | ICD-10-CM | POA: Insufficient documentation

## 2019-06-02 DIAGNOSIS — L0231 Cutaneous abscess of buttock: Secondary | ICD-10-CM | POA: Insufficient documentation

## 2019-06-02 LAB — CBC WITH DIFFERENTIAL/PLATELET
Abs Immature Granulocytes: 0.07 10*3/uL (ref 0.00–0.07)
Basophils Absolute: 0 10*3/uL (ref 0.0–0.1)
Basophils Relative: 0 %
Eosinophils Absolute: 0.2 10*3/uL (ref 0.0–0.5)
Eosinophils Relative: 2 %
HCT: 40.5 % (ref 39.0–52.0)
Hemoglobin: 12.9 g/dL — ABNORMAL LOW (ref 13.0–17.0)
Immature Granulocytes: 1 %
Lymphocytes Relative: 20 %
Lymphs Abs: 2.7 10*3/uL (ref 0.7–4.0)
MCH: 30.4 pg (ref 26.0–34.0)
MCHC: 31.9 g/dL (ref 30.0–36.0)
MCV: 95.3 fL (ref 80.0–100.0)
Monocytes Absolute: 1.4 10*3/uL — ABNORMAL HIGH (ref 0.1–1.0)
Monocytes Relative: 10 %
Neutro Abs: 9.7 10*3/uL — ABNORMAL HIGH (ref 1.7–7.7)
Neutrophils Relative %: 67 %
Platelets: 379 10*3/uL (ref 150–400)
RBC: 4.25 MIL/uL (ref 4.22–5.81)
RDW: 13.7 % (ref 11.5–15.5)
WBC: 14.1 10*3/uL — ABNORMAL HIGH (ref 4.0–10.5)
nRBC: 0 % (ref 0.0–0.2)

## 2019-06-02 LAB — ACETAMINOPHEN LEVEL: Acetaminophen (Tylenol), Serum: <10 ug/mL — ABNORMAL LOW (ref 10–30)

## 2019-06-02 LAB — COMPREHENSIVE METABOLIC PANEL
ALT: 27 U/L (ref 0–44)
AST: 26 U/L (ref 15–41)
Albumin: 3.3 g/dL — ABNORMAL LOW (ref 3.5–5.0)
Alkaline Phosphatase: 65 U/L (ref 38–126)
Anion gap: 11 (ref 5–15)
BUN: 6 mg/dL (ref 6–20)
CO2: 23 mmol/L (ref 22–32)
Calcium: 9 mg/dL (ref 8.9–10.3)
Chloride: 103 mmol/L (ref 98–111)
Creatinine, Ser: 0.95 mg/dL (ref 0.61–1.24)
GFR calc Af Amer: >60 mL/min (ref >60)
GFR calc non Af Amer: >60 mL/min (ref >60)
Glucose, Bld: 89 mg/dL (ref 70–99)
Potassium: 4.5 mmol/L (ref 3.5–5.1)
Sodium: 137 mmol/L (ref 135–145)
Total Bilirubin: 1 mg/dL (ref 0.3–1.2)
Total Protein: 7.8 g/dL (ref 6.5–8.1)

## 2019-06-02 MED ORDER — DOXYCYCLINE HYCLATE 100 MG PO CAPS: 100.0000 mg | ORAL_CAPSULE | Freq: Two times a day (BID) | ORAL | 0 refills | Status: DC

## 2019-06-02 MED ORDER — LIDOCAINE-EPINEPHRINE (PF) 2 %-1:200000 IJ SOLN: 20.0000 mL | Freq: Once | INTRAMUSCULAR | Status: DC

## 2019-06-02 NOTE — ED Provider Notes (Signed)
[facility]   CSN: [id number] Arrival date & time: 06/02/19  0730     History   Chief Complaint Chief Complaint  Patient presents with  . Abscess    HPI Patrick Pierce is a 33 y.o. male medical history of right inguinal hernia presents emergency department today with chief complaint of abscess.  He says the abscess has been present x3 days.  It is located in his gluteal fold.  He has been taking 500 mg of Tylenol every hour for 8 hours.  He reports transient pain relief.  Pain is located in the abscess and radiates up to his back.  States pain is 10 of 10 severity.  He has been unable to walk or sit because the pain is so severe.  Attempted to pop the abscess and has had purulent drainage.  Last bowel movement was yesterday, he denies any actual pain.  He has history of abscesses in this area and states his last one was a while ago.  He denies fever, chills, abdominal pain, nausea, vomiting. History provided by patient with additional history obtained from chart review.       Past Medical History:  Diagnosis Date  . Hernia of testicle     Patient Active Problem List   Diagnosis Date Noted  . Right inguinal hernia 06/01/2012    History reviewed. No pertinent surgical history.      Home Medications    Prior to Admission medications   Medication Sig Start Date End Date Taking? Authorizing Provider  acetaminophen (TYLENOL) 650 MG CR tablet Take 1,300 mg by mouth every 8 (eight) hours as needed for pain.    [provider]  doxycycline (VIBRAMYCIN) 100 MG capsule Take 1 capsule (100 mg total) by mouth 2 (two) times daily. 06/02/19   Albrizze, Caroleen HammanKaitlyn E, PA-C    Family History Family History  Problem Relation Age of Onset  . HIV/AIDS Mother   . Alcohol abuse Mother   . Congenital heart disease Son     Social History Social History   Tobacco Use  . Smoking status: Current Every Day Smoker    Packs/day: 1.00   Years: 15.00    Pack years: 15.00    Types: Cigarettes  . Smokeless tobacco: Never Used  Substance Use Topics  . Alcohol use: No  . Drug use: No     Allergies   Aspirin   Review of Systems Review of Systems  Constitutional: Negative for chills and fever.  HENT: Negative for congestion, rhinorrhea, sinus pressure and sore throat.   Eyes: Negative for pain and redness.  Respiratory: Negative for cough, shortness of breath and wheezing.   Cardiovascular: Negative for chest pain and palpitations.  Gastrointestinal: Negative for abdominal pain, constipation, diarrhea, nausea and vomiting.  Genitourinary: Negative for dysuria.  Musculoskeletal: Negative for arthralgias, back pain, myalgias and neck pain.  Skin: Positive for wound. Negative for rash.  Neurological: Negative for dizziness, syncope, weakness, numbness and headaches.  Psychiatric/Behavioral: Negative for confusion.     Physical Exam Updated Vital Signs BP 131/62 (BP Location: Right Arm)   Pulse 96   Temp 98.8 F (37.1 C) (Oral)   Resp 14   SpO2 96%   Physical Exam Vitals signs and nursing note reviewed.  Constitutional:      General: He is not in acute distress.    Appearance: He is not ill-appearing.  HENT:     Head: Normocephalic and atraumatic.  Right Ear: Tympanic membrane and external ear normal.     Left Ear: Tympanic membrane and external ear normal.     Nose: Nose normal.     Mouth/Throat:     Mouth: Mucous membranes are moist.     Pharynx: Oropharynx is clear.  Eyes:     General: No scleral icterus.       Right eye: No discharge.        Left eye: No discharge.     Extraocular Movements: Extraocular movements intact.     Conjunctiva/sclera: Conjunctivae normal.     Pupils: Pupils are equal, round, and reactive to light.  Neck:     Musculoskeletal: Normal range of motion.     Vascular: No JVD.  Cardiovascular:     Rate and Rhythm: Normal rate and regular rhythm.     Pulses: Normal  pulses.          Radial pulses are 2+ on the right side and 2+ on the left side.     Heart sounds: Normal heart sounds.  Pulmonary:     Comments: Lungs clear to auscultation in all fields. Symmetric chest rise. No wheezing, rales, or rhonchi. Abdominal:     Comments: Abdomen is soft, non-distended, and non-tender in all quadrants. No rigidity, no guarding. No peritoneal signs.  Genitourinary:    Comments: Exam chaperoned by nurse tech.  Patient has 5 x 7 abscess to left gluteal fold.  There are multiple areas of purulent drainage.  There is surrounding erythema.  Abscess is extremely tender to palpation.  See picture below. Musculoskeletal: Normal range of motion.  Skin:    General: Skin is warm and dry.     Capillary Refill: Capillary refill takes less than 2 seconds.  Neurological:     Mental Status: He is oriented to person, place, and time.     GCS: GCS eye subscore is 4. GCS verbal subscore is 5. GCS motor subscore is 6.     Comments: Fluent speech, no facial droop.  Psychiatric:        Behavior: Behavior normal.          ED Treatments / Results  Labs (all labs ordered are listed, but only abnormal results are displayed) Labs Reviewed  ACETAMINOPHEN LEVEL - Abnormal; Notable for the following components:      Result Value   Acetaminophen (Tylenol), Serum <10 (*)    All other components within normal limits  COMPREHENSIVE METABOLIC PANEL - Abnormal; Notable for the following components:   Albumin 3.3 (*)    All other components within normal limits  CBC WITH DIFFERENTIAL/PLATELET - Abnormal; Notable for the following components:   WBC 14.1 (*)    Hemoglobin 12.9 (*)    Neutro Abs 9.7 (*)    Monocytes Absolute 1.4 (*)    All other components within normal limits    EKG None  Radiology No results found.  Procedures Procedures (including critical care time)  Medications Ordered in ED Medications  lidocaine-EPINEPHrine (XYLOCAINE W/EPI) 2 %-1:200000 (PF)  injection 20 mL (20 mLs Infiltration Not Given 06/02/19 1313)     Initial Impression / Assessment and Plan / ED Course  I have reviewed the triage vital signs and the nursing notes.  Pertinent labs & imaging results that were available during my care of the patient were reviewed by me and considered in my medical decision making (see chart for details).  Patient seen and examined. Patient nontoxic appearing, in no apparent distress, vitals WNL, afebrile.  He has an abscess on left gluteal fold that is tender to palpation.  There is minimal purulent drainage.  He has been taking Tylenol every hour for 8 hours.  I discussed with patient he has been taking this correctly.  He denies any suicidal ideations or self harm, he simply read the directions wrong. We discussed at length how to take it correctly.  Plan for I&D. Today are remarkable for leukocytosis of 14.1 with left shift.  His CMP is unremarkable.  His acetaminophen level is negative. When reassessing patient it appears that the abscess has spontaneously opened and there is copious purulent drainage.  Perform bedside ultrasound and there appears to be surrounding cobblestoning however no large pockets of fluid. Will not proceed with I&D as planned.  The patient appears reasonably screened and/or stabilized for discharge and I doubt any other medical condition or other [facility] requiring further screening, evaluation, or treatment in the ED at this time prior to discharge.  Will discharge home with prescription for antibiotic.Encouraged home warm soaks and flushing.   The patient is safe for discharge with strict return precautions discussed. Recommend pcp follow up in 2 days to have wound rechecked. Findings and plan of care discussed with supervising physician Dr. Regenia Skeeter.   Portions of this note were generated with Lobby[employer]. Dictation errors may occur despite best attempts at proofreading.     Final Clinical Impressions(s) / ED  Diagnoses   Final diagnoses:  Abscess of buttock, left    ED Discharge Orders         Ordered    doxycycline (VIBRAMYCIN) 100 MG capsule  2 times daily     06/02/19 1314           Albrizze, Harley Hallmark, PA-C 06/02/19 1342    Sherwood Gambler, MD 06/02/19 1456

## 2019-06-02 NOTE — Discharge Instructions (Addendum)
1. Medications: tylenol and ibuprofen for pain, antibiotic doxycycline, continue usual home medications. Take as directed 2. Treatment: rest, drink plenty of fluids, use warm compresses, flush abscess with warm water several times per day.  Take warm baths as we discussed  3. Follow Up: Please followup with your primary doctor on Monday for wound recheck; if you are unable to see your doctor you can return to the emergency department for recheck  Please return to the ER for fevers, chills, nausea, vomiting or other signs of infection

## 2019-06-02 NOTE — ED Triage Notes (Signed)
Pt reports intense pain for last 3 days in buttocks with hx of abscesses. Pt has taking 800 mg of Tylenol, helping pain for one hour and the pain returns.

## 2019-10-05 ENCOUNTER — Encounter: Payer: Self-pay | Admitting: Internal Medicine

## 2020-02-14 ENCOUNTER — Emergency Department (HOSPITAL_COMMUNITY)
Admission: EM | Admit: 2020-02-14 | Discharge: 2020-02-14 | Discharge status: Against Medical Advice | Payer: Self-pay | Attending: Emergency Medicine | Admitting: Emergency Medicine

## 2020-02-14 ENCOUNTER — Encounter (HOSPITAL_COMMUNITY): Payer: Self-pay

## 2020-02-14 ENCOUNTER — Other Ambulatory Visit: Payer: Self-pay

## 2020-02-14 DIAGNOSIS — R0602 Shortness of breath: Secondary | ICD-10-CM | POA: Insufficient documentation

## 2020-02-14 DIAGNOSIS — Z5321 Procedure and treatment not carried out due to patient leaving prior to being seen by health care provider: Secondary | ICD-10-CM | POA: Insufficient documentation

## 2020-02-14 NOTE — ED Notes (Signed)
No answer for vitals recheck x1 

## 2020-02-14 NOTE — ED Notes (Signed)
No answer x2 

## 2020-02-14 NOTE — ED Triage Notes (Signed)
Patient complains of abscess to buttocks for several days, has hx of same. Patient also has intermittent SOB but only wants the abscess addressed

## 2020-02-16 ENCOUNTER — Other Ambulatory Visit: Payer: Self-pay

## 2020-02-16 ENCOUNTER — Ambulatory Visit (HOSPITAL_COMMUNITY)
Admission: EM | Admit: 2020-02-16 | Discharge: 2020-02-16 | Disposition: A | Discharge status: Home / Self Care | Payer: Self-pay | Attending: Family Medicine | Admitting: Family Medicine

## 2020-02-16 ENCOUNTER — Encounter (HOSPITAL_COMMUNITY): Payer: Self-pay

## 2020-02-16 DIAGNOSIS — L0231 Cutaneous abscess of buttock: Secondary | ICD-10-CM | POA: Insufficient documentation

## 2020-02-16 DIAGNOSIS — U071 COVID-19: Secondary | ICD-10-CM | POA: Insufficient documentation

## 2020-02-16 DIAGNOSIS — Z8279 Family history of other congenital malformations, deformations and chromosomal abnormalities: Secondary | ICD-10-CM | POA: Insufficient documentation

## 2020-02-16 DIAGNOSIS — Z886 Allergy status to analgesic agent status: Secondary | ICD-10-CM | POA: Insufficient documentation

## 2020-02-16 DIAGNOSIS — M7918 Myalgia, other site: Secondary | ICD-10-CM | POA: Insufficient documentation

## 2020-02-16 DIAGNOSIS — F17210 Nicotine dependence, cigarettes, uncomplicated: Secondary | ICD-10-CM | POA: Insufficient documentation

## 2020-02-16 MED ORDER — DOXYCYCLINE HYCLATE 100 MG PO CAPS
100.0000 mg | ORAL_CAPSULE | Freq: Two times a day (BID) | ORAL | 0 refills | Status: AC
Start: 2020-02-16 — End: ?

## 2020-02-16 NOTE — Discharge Instructions (Addendum)
Keep dry and covered for next 24-48 hours  Return on Sunday morning to have packing removed.  After packing is removed in 48 hours you may then begin appling warm compresses 3-4x daily for 10-15 minutes.  You may then wash site daily with warm water and mild soap Keep covered to avoid friction  Take antibiotic as prescribed and to completion  Return sooner or go to the ED if you have any new or worsening symptoms such as increased redness, swelling, pain, nausea, vomiting, fever, chills, etc...  Your COVID test is pending.  You should self quarantine until the test result is back.    Take Tylenol as needed for fever or discomfort.  Rest and keep yourself hydrated.    Go to the emergency department if you develop shortness of breath, severe diarrhea, high fever not relieved by Tylenol or ibuprofen, or other concerning symptoms.

## 2020-02-16 NOTE — ED Triage Notes (Signed)
Patient has a cyst in between the gluteal folds for 4 days. Reports worsening pain, redness, swelling. Went to the ED yesterday but left without being seen. Denies fevers, chills, body aches. Reports loss of smell for 1 week, denies sinus congestion, cough, SOB.

## 2020-02-16 NOTE — ED Provider Notes (Addendum)
[place]   [id number] 02/16/20 Arrival Time: 0811   [id number]  SUBJECTIVE:  Patrick Pierce is a 34 y.o. male who presents with a possible abscess of his L buttock. Onset gradual, approximately 2 weeks ago. Reports that he has been applying warm compresses and has had some drainage. Reports that it is painful for him to sit, stand, or walk. Has not taken any medications at this time. Reports that he has had this occur before and it resolved on its own. Also reports that he has lost his sense of smell x 5 days ago. Denies cough, fever, rhinorrhea, Covid exposure, n/v/d, rash, other symptoms.  ROS: As per HPI.  All other pertinent ROS negative.     Past Medical History:  Diagnosis Date  . Hernia of testicle    Past Surgical History:  Procedure Laterality Date  . HERNIA REPAIR     Allergies  Allergen Reactions  . Aspirin Anaphylaxis    Swelling under tongue   No current facility-administered medications on file prior to encounter.   Current Outpatient Medications on File Prior to Encounter  Medication Sig Dispense Refill  . acetaminophen (TYLENOL) 650 MG CR tablet Take 1,300 mg by mouth every 8 (eight) hours as needed for pain.     Social History   Socioeconomic History  . Marital status: Single    Spouse name: Not on file  . Number of children: Not on file  . Years of education: Not on file  . Highest education level: Not on file  Occupational History  . Not on file  Tobacco Use  . Smoking status: Current Every Day Smoker    Packs/day: 1.00    Years: 15.00    Pack years: 15.00    Types: Cigarettes  . Smokeless tobacco: Never Used  Substance and Sexual Activity  . Alcohol use: No  . Drug use: No  . Sexual activity: Not on file  Other Topics Concern  . Not on file  Social History Narrative  . Not on file   Social Determinants of Health   Financial Resource Strain:   . Difficulty of Paying Living Expenses:   Food Insecurity:   . Worried About  Charity fundraiser in the Last Year:   . Arboriculturist in the Last Year:   Transportation Needs:   . Film/video editor (Medical):   Marland Kitchen Lack of Transportation (Non-Medical):   Physical Activity:   . Days of Exercise per Week:   . Minutes of Exercise per Session:   Stress:   . Feeling of Stress :   Social Connections:   . Frequency of Communication with Friends and Family:   . Frequency of Social Gatherings with Friends and Family:   . Attends Religious Services:   . Active Member of Clubs or Organizations:   . Attends Archivist Meetings:   Marland Kitchen Marital Status:   Intimate Partner Violence:   . Fear of Current or Ex-Partner:   . Emotionally Abused:   Marland Kitchen Physically Abused:   . Sexually Abused:    Family History  Problem Relation Age of Onset  . HIV/AIDS Mother   . Alcohol abuse Mother   . Congenital heart disease Son     OBJECTIVE:  Vitals:   02/16/20 0833  BP: (!) 145/78  Pulse: 86  Resp: 16  Temp: 98.1 F (36.7 C)  TempSrc: Oral  SpO2: 97%     General appearance: alert; no distress Skin: 5 cm induration  of his L buttock; tender to touch; no active drainage Cardiac: S1, S2, regular rate and rhythm, no extremity swelling Pulmonary: Lungs CTA bilaterally, unlabored respirations, no cough noted Psychological: alert and cooperative; normal mood and affect  Procedure: Verbal consent obtained. Area over induration cleaned with betadine. Lidocaine 2% without epinephrine used to obtain local anesthesia. The most fluctuant portion of the abscess was incised with a #11 blade scalpel. Abscess cavity explored and evacuated. Loculations broken up with a curved hemostat as best as possible given patient discomfort. Cavity packed with 2 inches of iodoform packing material and dressed with a clean gauze dressing. Minimal bleeding. No complications.  ASSESSMENT & PLAN:  1. Cutaneous abscess of buttock   2. Pain in left buttock     Meds ordered this encounter    Medications  . doxycycline (VIBRAMYCIN) 100 MG capsule    Sig: Take 1 capsule (100 mg total) by mouth 2 (two) times daily.    Dispense:  14 capsule    Refill:  0    Order Specific Question:   Supervising Provider    Answer:   Merrilee Jansky [id number]    Abscess with incision and drainage:  Keep dry and covered for next 24-48 hours Remove packing in 48 hours at this office After packing is removed in you may then begin appling warm compresses 3-4x daily for 10-15 minutes.  You may then wash site daily with warm water and mild soap Keep covered to avoid friction Take antibiotic as prescribed and to completion Return sooner or go to the ED if you have any new or worsening symptoms such as increased redness, swelling, pain, nausea, vomiting, fever, chills   Covid swab obtained in office today.  Patient instructed to quarantine until results are back and negative.  If results are negative, patient may resume daily schedule as tolerated once they are fever free for 24 hours without the use of antipyretic medications.  If results are positive, patient instructed to quarantine 10 days from today.  Patient instructed to follow-up with primary care with this office as needed.  Patient instructed to follow-up in the ER for trouble swallowing, trouble breathing, other concerning symptoms.   Reviewed expectations re: course of current medical issues. Questions answered. Outlined signs and symptoms indicating need for more acute intervention. Patient verbalized understanding.  After Visit Summary given.          Moshe Cipro, NP 02/16/20 1139    Moshe Cipro, NP 02/16/20 1143

## 2020-02-17 ENCOUNTER — Encounter: Payer: Self-pay | Admitting: Internal Medicine

## 2020-02-17 LAB — SARS CORONAVIRUS 2 (TAT 6-24 HRS): SARS Coronavirus 2: POSITIVE — AB

## 2020-02-18 ENCOUNTER — Telehealth: Payer: Self-pay | Admitting: Nurse Practitioner

## 2020-02-18 ENCOUNTER — Other Ambulatory Visit: Payer: Self-pay

## 2020-02-18 ENCOUNTER — Ambulatory Visit (HOSPITAL_COMMUNITY)
Admission: EM | Admit: 2020-02-18 | Discharge: 2020-02-18 | Disposition: A | Discharge status: Home / Self Care | Payer: Self-pay

## 2020-02-18 ENCOUNTER — Telehealth: Payer: Self-pay | Admitting: Adult Health

## 2020-02-18 ENCOUNTER — Encounter (HOSPITAL_COMMUNITY): Payer: Self-pay

## 2020-02-18 DIAGNOSIS — Z4800 Encounter for change or removal of nonsurgical wound dressing: Secondary | ICD-10-CM

## 2020-02-18 DIAGNOSIS — Z5189 Encounter for other specified aftercare: Secondary | ICD-10-CM

## 2020-02-18 NOTE — Discharge Instructions (Signed)
Continue doxycycline Warm compresses/soaks and baths  Please follow-up if any symptoms not continuing to improve or worsening

## 2020-02-18 NOTE — Telephone Encounter (Signed)
Called to discuss with Tedd Sias about Covid symptoms and the use of  bamlanivimab/etesevimab or casirivimab/imdevimab, a combination monoclonal antibody infusion for those with mild to moderate Covid symptoms and at a high risk of hospitalization.     Pt is qualified for this infusion at the [facility] infusion center due to co-morbid conditions and/or a member of an at-risk group (BMI >25 and at risk group-AA).   Patient was being seen at [facility] and was able to secure chat with provider in regards to assessing patient's wishes to receive infusion. Per provider patient states he feels that he is improving, however would like to consider. Based on symptoms made aware last date to be eligible to receive infusion is 02/20/20.   Also attempted to reach patient. Unable to reach. Voicemail left.     Patient Active Problem List   Diagnosis Date Noted  . Right inguinal hernia 06/01/2012    Willette Alma, AGPCNP-BC Pager: [phone] Amion: N. [place]

## 2020-02-18 NOTE — Telephone Encounter (Signed)
Called to discuss with Patrick Pierce about Covid symptoms and the use of bamlanivimab, a monoclonal antibody infusion for those with mild to moderate Covid symptoms and at a high risk of hospitalization.     Unable to reach, left message to call back and sent MyChart Message   Astra Gregg NP-C

## 2020-02-18 NOTE — ED Triage Notes (Signed)
Patient here to have packing removed.

## 2020-02-18 NOTE — ED Provider Notes (Signed)
[facility]    CSN: [id number] Arrival date & time: 02/18/20  1023      History   Chief Complaint Chief Complaint  Patient presents with  . Wound Check    HPI Patrick Pierce is a 34 y.o. male currently Covid positive presenting today for packing removal and abscess check.  Patient had drainage of abscess 2 days ago to pilonidal/gluteal area.  Reports improvement in symptoms and pain, but does report continued drainage.  Denies fevers.  Has started doxycycline.  Reports mild Covid symptoms, cough and loss of smell, denies any chest pain or shortness of breath.  Feels symptoms are moving in the right direction.  HPI  Past Medical History:  Diagnosis Date  . Hernia of testicle     Patient Active Problem List   Diagnosis Date Noted  . Right inguinal hernia 06/01/2012    Past Surgical History:  Procedure Laterality Date  . HERNIA REPAIR         Home Medications    Prior to Admission medications   Medication Sig Start Date End Date Taking? Authorizing Provider  acetaminophen (TYLENOL) 650 MG CR tablet Take 1,300 mg by mouth every 8 (eight) hours as needed for pain.    [provider]  doxycycline (VIBRAMYCIN) 100 MG capsule Take 1 capsule (100 mg total) by mouth 2 (two) times daily. 02/16/20   Moshe Cipro, NP    Family History Family History  Problem Relation Age of Onset  . HIV/AIDS Mother   . Alcohol abuse Mother   . Congenital heart disease Son     Social History Social History   Tobacco Use  . Smoking status: Current Every Day Smoker    Packs/day: 1.00    Years: 15.00    Pack years: 15.00    Types: Cigarettes  . Smokeless tobacco: Never Used  Substance Use Topics  . Alcohol use: No  . Drug use: No     Allergies   Aspirin   Review of Systems Review of Systems  Constitutional: Negative for fatigue and fever.  Eyes: Negative for redness, itching and visual disturbance.  Respiratory: Negative for shortness of breath.    Cardiovascular: Negative for chest pain and leg swelling.  Gastrointestinal: Negative for nausea and vomiting.  Musculoskeletal: Negative for arthralgias and myalgias.  Skin: Positive for wound. Negative for color change and rash.  Neurological: Negative for dizziness, syncope, weakness, light-headedness and headaches.     Physical Exam Triage Vital Signs ED Triage Vitals  Enc Vitals Group     BP 02/18/20 1112 (!) 149/61     Pulse Rate 02/18/20 1112 77     Resp 02/18/20 1112 17     Temp 02/18/20 1112 98.3 F (36.8 C)     Temp Source 02/18/20 1112 Oral     SpO2 02/18/20 1112 98 %     Weight --      Height --      Head Circumference --      Peak Flow --      Pain Score 02/18/20 1111 4     Pain Loc --      Pain Edu? --      Excl. in GC? --    No data found.  Updated Vital Signs BP (!) 149/61 (BP Location: Right Arm)   Pulse 77   Temp 98.3 F (36.8 C) (Oral)   Resp 17   SpO2 98%   Visual Acuity Right Eye Distance:   Left Eye Distance:  Bilateral Distance:    Right Eye Near:   Left Eye Near:    Bilateral Near:     Physical Exam Vitals and nursing note reviewed.  Constitutional:      Appearance: He is well-developed.     Comments: No acute distress  HENT:     Head: Normocephalic and atraumatic.     Nose: Nose normal.  Eyes:     Conjunctiva/sclera: Conjunctivae normal.  Cardiovascular:     Rate and Rhythm: Normal rate.  Pulmonary:     Effort: Pulmonary effort is normal. No respiratory distress.  Abdominal:     General: There is no distension.  Musculoskeletal:        General: Normal range of motion.     Cervical back: Neck supple.  Skin:    General: Skin is warm and dry.     Comments: Left superior aspect of gluteal area with draining abscess, packing removed, mild surrounding induration, pustular drainage present  Neurological:     Mental Status: He is alert and oriented to person, place, and time.      UC Treatments / Results  Labs (all labs  ordered are listed, but only abnormal results are displayed) Labs Reviewed - No data to display  EKG   Radiology No results found.  Procedures Procedures (including critical care time)  Medications Ordered in UC Medications - No data to display  Initial Impression / Assessment and Plan / UC Course  I have reviewed the triage vital signs and the nursing notes.  Pertinent labs & imaging results that were available during my care of the patient were reviewed by me and considered in my medical decision making (see chart for details).     Packing removed, symptoms improving, continue doxycycline, warm compresses and monitor for continued gradual improvement.  Continue quarantining for Covid and follow-up for any worsening symptoms or shortness of breath, chest pain.  Discussed strict return precautions. Patient verbalized understanding and is agreeable with plan.  Final Clinical Impressions(s) / UC Diagnoses   Final diagnoses:  Wound check, abscess  Abscess packing removal     Discharge Instructions     Continue doxycycline Warm compresses/soaks and baths  Please follow-up if any symptoms not continuing to improve or worsening   ED Prescriptions    None     PDMP not reviewed this encounter.   Janith Lima, PA-C 02/18/20 1142

## 2020-05-23 ENCOUNTER — Encounter: Payer: Self-pay | Admitting: Internal Medicine

## 2023-06-23 ENCOUNTER — Emergency Department (HOSPITAL_COMMUNITY)
Admission: EM | Admit: 2023-06-23 | Discharge: 2023-06-23 | Disposition: A | Discharge status: Home / Self Care | Payer: Self-pay | Attending: Emergency Medicine | Admitting: Emergency Medicine

## 2023-06-23 ENCOUNTER — Other Ambulatory Visit: Payer: Self-pay

## 2023-06-23 ENCOUNTER — Encounter (HOSPITAL_COMMUNITY): Payer: Self-pay

## 2023-06-23 DIAGNOSIS — L0231 Cutaneous abscess of buttock: Secondary | ICD-10-CM | POA: Insufficient documentation

## 2023-06-23 MED ORDER — AMOXICILLIN-POT CLAVULANATE 875-125 MG PO TABS: 1.0000 | ORAL_TABLET | Freq: Two times a day (BID) | ORAL | 0 refills | Status: DC

## 2023-06-23 MED ORDER — LIDOCAINE-EPINEPHRINE (PF) 2 %-1:200000 IJ SOLN
10.0000 mL | Freq: Once | INTRAMUSCULAR | Status: AC
  Administered 2023-06-23: 10 mL
  Filled 2023-06-23: qty 20

## 2023-06-23 NOTE — ED Triage Notes (Signed)
Pt c/o intermittent abscess in buttocks since 2020.  Pt reports abscess most recently came back 4-5 months ago.  Pt sts area has been bleeding and draining.  Pain score 7/10.    Pt sts he was previously told he needed surgery but he never followed up.

## 2023-06-23 NOTE — Discharge Instructions (Addendum)
Warm soaks may help with the drainage.  Follow-up with general surgery.

## 2023-06-23 NOTE — ED Provider Notes (Signed)
[place] EMERGENCY DEPARTMENT AT [facility] Provider Note   CSN: [id number] Arrival date & time: 06/23/23  4098     History  Chief Complaint  Patient presents with   Abscess    Patrick Pierce is a 37 y.o. male.   Abscess Patient presents with buttock abscess.  Has had previously but last one around 3 years ago.  Couple months ago began again.  It is on his left cheek near the top.  States it has been draining pus.  States it will scab up and then drain again.  Some mild pain at the top.  No fevers.  Not diabetic.  He states on the lower aspect of it there is always a firm mass now.    Past Medical History:  Diagnosis Date   Hernia of testicle     Home Medications Prior to Admission medications   Medication Sig Start Date End Date Taking? Authorizing Provider  amoxicillin-clavulanate (AUGMENTIN) 875-125 MG tablet Take 1 tablet by mouth every 12 (twelve) hours. 06/23/23  Yes Benjiman Core, MD  acetaminophen (TYLENOL) 650 MG CR tablet Take 1,300 mg by mouth every 8 (eight) hours as needed for pain.    [provider]      Allergies    Aspirin    Review of Systems   Review of Systems  Physical Exam Updated Vital Signs BP 130/80 (BP Location: Right Arm)   Pulse 80   Temp 98.2 F (36.8 C) (Oral)   Resp 18   Ht 5\' 7"  (1.702 m)   Wt 136.1 kg   SpO2 99%   BMI 46.99 kg/m  Physical Exam Vitals reviewed.  Skin:    Comments: The superior aspect of the left buttock medially has a indurated area with fluctuance more laterally.  There is approximately 6 cm across total.  Inferior and medially there is a firm mass.  Neurological:     Mental Status: He is alert and oriented to person, place, and time.     ED Results / Procedures / Treatments   Labs (all labs ordered are listed, but only abnormal results are displayed) Labs Reviewed - No data to display  EKG None  Radiology No results found.  Procedures .Marland KitchenIncision and Drainage  Date/Time:  06/23/2023 10:15 AM  Performed by: Benjiman Core, MD Authorized by: Benjiman Core, MD   Consent:    Consent obtained:  Verbal   Consent given by:  Patient   Risks, benefits, and alternatives were discussed: yes     Risks discussed:  Bleeding, incomplete drainage and damage to other organs   Alternatives discussed:  No treatment Location:    Type:  Abscess   Size:  6 cm   Location:  Anogenital   Anogenital location:  Gluteal cleft Pre-procedure details:    Skin preparation:  Chlorhexidine Sedation:    Sedation type:  None Anesthesia:    Anesthesia method:  Local infiltration   Local anesthetic:  Lidocaine 2% WITH epi Procedure type:    Complexity:  Simple Procedure details:    Ultrasound guidance: no     Needle aspiration: no     Incision types:  Single straight   Incision depth:  Subcutaneous   Wound management:  Irrigated with saline   Drainage:  Bloody   Drainage amount:  Scant   Wound treatment:  Wound left open   Packing materials:  None Post-procedure details:    Procedure completion:  Tolerated well, no immediate complications     Medications  Ordered in ED Medications  lidocaine-EPINEPHrine (XYLOCAINE W/EPI) 2 %-1:200000 (PF) injection 10 mL (10 mLs Infiltration Given 06/23/23 1039)    ED Course/ Medical Decision Making/ A&P                                 Medical Decision Making Risk Prescription drug management.   Patient likely buttock abscess.  Potentially pilonidal but is little more lateral.  Will drain.  Patient is not diabetic and is well-appearing.  Mass drained with minimal amount of purulence.  Mostly small amount of blood.  Discharge home with antibiotics and general surgery follow-up.        Final Clinical Impression(s) / ED Diagnoses Final diagnoses:  Abscess of buttock, right    Rx / DC Orders ED Discharge Orders          Ordered    amoxicillin-clavulanate (AUGMENTIN) 875-125 MG tablet  Every 12 hours        06/23/23  1039              Benjiman Core, MD 06/23/23 1525

## 2023-06-23 NOTE — ED Notes (Signed)
ED Provider at bedside. 

## 2023-11-18 ENCOUNTER — Encounter (HOSPITAL_COMMUNITY): Payer: Self-pay

## 2023-11-18 ENCOUNTER — Emergency Department (HOSPITAL_COMMUNITY)
Admission: EM | Admit: 2023-11-18 | Discharge: 2023-11-18 | Disposition: A | Discharge status: Home / Self Care | Payer: Self-pay

## 2023-11-18 ENCOUNTER — Emergency Department (HOSPITAL_COMMUNITY): Payer: Self-pay

## 2023-11-18 ENCOUNTER — Other Ambulatory Visit: Payer: Self-pay

## 2023-11-18 DIAGNOSIS — J189 Pneumonia, unspecified organism: Secondary | ICD-10-CM

## 2023-11-18 DIAGNOSIS — J181 Lobar pneumonia, unspecified organism: Secondary | ICD-10-CM | POA: Insufficient documentation

## 2023-11-18 DIAGNOSIS — J45909 Unspecified asthma, uncomplicated: Secondary | ICD-10-CM | POA: Insufficient documentation

## 2023-11-18 DIAGNOSIS — J449 Chronic obstructive pulmonary disease, unspecified: Secondary | ICD-10-CM | POA: Insufficient documentation

## 2023-11-18 DIAGNOSIS — F17210 Nicotine dependence, cigarettes, uncomplicated: Secondary | ICD-10-CM | POA: Insufficient documentation

## 2023-11-18 LAB — RESP PANEL BY RT-PCR (RSV, FLU A&B, COVID)  RVPGX2
Influenza A by PCR: NEGATIVE
Influenza B by PCR: NEGATIVE
Resp Syncytial Virus by PCR: NEGATIVE
SARS Coronavirus 2 by RT PCR: NEGATIVE

## 2023-11-18 LAB — GROUP A STREP BY PCR: Group A Strep by PCR: NOT DETECTED

## 2023-11-18 MED ORDER — PREDNISONE 20 MG PO TABS: 40.0000 mg | ORAL_TABLET | Freq: Every day | ORAL | 0 refills | Status: AC

## 2023-11-18 MED ORDER — ALBUTEROL SULFATE HFA 108 (90 BASE) MCG/ACT IN AERS: 1.0000 | INHALATION_SPRAY | Freq: Four times a day (QID) | RESPIRATORY_TRACT | 0 refills | Status: AC | PRN

## 2023-11-18 MED ORDER — AMOXICILLIN-POT CLAVULANATE 875-125 MG PO TABS: 1.0000 | ORAL_TABLET | Freq: Two times a day (BID) | ORAL | 0 refills | Status: AC

## 2023-11-18 MED ORDER — AMOXICILLIN-POT CLAVULANATE 875-125 MG PO TABS
1.0000 | ORAL_TABLET | Freq: Once | ORAL | Status: AC
  Administered 2023-11-18: 1 via ORAL
  Filled 2023-11-18: qty 1

## 2023-11-18 MED ORDER — IPRATROPIUM-ALBUTEROL 0.5-2.5 (3) MG/3ML IN SOLN
3.0000 mL | Freq: Once | RESPIRATORY_TRACT | Status: AC
  Administered 2023-11-18: 3 mL via RESPIRATORY_TRACT
  Filled 2023-11-18: qty 3

## 2023-11-18 MED ORDER — PREDNISONE 20 MG PO TABS
40.0000 mg | ORAL_TABLET | Freq: Once | ORAL | Status: AC
  Administered 2023-11-18: 40 mg via ORAL
  Filled 2023-11-18: qty 2

## 2023-11-18 NOTE — ED Notes (Addendum)
 Walking spo2 93-95%, HR 104. Pt tolerated well, no dizziness but c/o feeling wheezy. Awaiting duoneb treatment.

## 2023-11-18 NOTE — ED Notes (Signed)
 Patient discharged. Provider spoke to patient. Paperwork given to patient and reviewed. Pt verbalized understanding. VSS. A+Ox4. Patient ambulated out of the ER with steady, independent gait. No IV in place.

## 2023-11-18 NOTE — Discharge Instructions (Addendum)
 Pleasure taking care of you today.  You seen the ER for cough and congestion and shortness of breath with walking.  Your oxygen saturation did not drop when you are walking.  Your x-ray is consistent with pneumonia.  We are going to treat with antibiotics.  Will also give you prednisone and inhaler since you had been wheezing with this as well.  Please stop smoking.  As discussed you can follow-up with your PCP, they may be able to prescribe you medications to help with smoking cessation.  You also use the over-the-counter nicotine replacement products.  Follow-up close with your primary care doctor, come back to the ER if you have new or worsening symptoms but if you have return of your fever, worsening shortness of breath, chest pain or other worrisome changes.

## 2023-11-18 NOTE — ED Provider Notes (Signed)
 [place] EMERGENCY DEPARTMENT AT [facility] Provider Note   CSN: [id number] Arrival date & time: 11/18/23  1055     History  Chief Complaint  Patient presents with   Cough    Patrick Pierce is a 38 y.o. male.  He has PMH of obesity, tobacco abuse.  The ER today for a week of cough congestion, had fever up to 102 Fahrenheit 2 days ago, no fever today.  Presenting for productive cough and shortness of breath on walking now.  No chest pain, nausea or vomiting, patient smokes cigarettes, no history of drug use or alcohol use.   Cough      Home Medications Prior to Admission medications   Medication Sig Start Date End Date Taking? Authorizing Provider  acetaminophen (TYLENOL) 650 MG CR tablet Take 1,300 mg by mouth every 8 (eight) hours as needed for pain.    [provider]  amoxicillin-clavulanate (AUGMENTIN) 875-125 MG tablet Take 1 tablet by mouth every 12 (twelve) hours. 06/23/23   Benjiman Core, MD      Allergies    Aspirin    Review of Systems   Review of Systems  Respiratory:  Positive for cough.     Physical Exam Updated Vital Signs BP (!) 143/92 (BP Location: Right Arm)   Pulse 88   Temp 98.2 F (36.8 C) (Oral)   Resp 18   Ht 5\' 7"  (1.702 m)   Wt (!) 137 kg   SpO2 97%   BMI 47.30 kg/m  Physical Exam Vitals and nursing note reviewed.  Constitutional:      General: He is not in acute distress.    Appearance: He is well-developed.  HENT:     Head: Normocephalic and atraumatic.     Mouth/Throat:     Mouth: Mucous membranes are moist.  Eyes:     Extraocular Movements: Extraocular movements intact.     Conjunctiva/sclera: Conjunctivae normal.     Pupils: Pupils are equal, round, and reactive to light.  Cardiovascular:     Rate and Rhythm: Normal rate and regular rhythm.     Heart sounds: No murmur heard. Pulmonary:     Effort: Pulmonary effort is normal. No respiratory distress.     Breath sounds: Normal breath sounds.   Abdominal:     Palpations: Abdomen is soft.     Tenderness: There is no abdominal tenderness.  Musculoskeletal:        General: No swelling.     Cervical back: Neck supple.  Skin:    General: Skin is warm and dry.     Capillary Refill: Capillary refill takes less than 2 seconds.  Neurological:     General: No focal deficit present.     Mental Status: He is alert and oriented to person, place, and time.  Psychiatric:        Mood and Affect: Mood normal.     ED Results / Procedures / Treatments   Labs (all labs ordered are listed, but only abnormal results are displayed) Labs Reviewed  RESP PANEL BY RT-PCR (RSV, FLU A&B, COVID)  RVPGX2  GROUP A STREP BY PCR    EKG None  Radiology No results found.  Procedures Procedures    Medications Ordered in ED Medications  ipratropium-albuterol (DUONEB) 0.5-2.5 (3) MG/3ML nebulizer solution 3 mL (has no administration in time range)  predniSONE (DELTASONE) tablet 40 mg (has no administration in time range)    ED Course/ Medical Decision Making/ A&P  Medical Decision Making DDx: Pneumonia, asthma exacerbation, bronchitis, other  Course: Patient with productive cough and some shortness of breath, noted to have some mild wheezing on exam resolved with albuterol, he does smoke with a history of COPD or asthma.  O2 saturations are normal, walking O2 sats are normal.  Chest x-ray shows likely bilateral pneumonia consistent with patient's symptoms we will treat with Augmentin advised on follow-up and strict return precautions.  Amount and/or Complexity of Data Reviewed Radiology: ordered.  Risk Prescription drug management.           Final Clinical Impression(s) / ED Diagnoses Final diagnoses:  None    Rx / DC Orders ED Discharge Orders     None         Josem Kaufmann 11/18/23 1839    Durwin Glaze, MD 11/19/23 [phone]

## 2023-11-18 NOTE — ED Triage Notes (Signed)
 Pt arrived via POV c/o generalized weakness, cough and fever of 102.69F at home. Pt also endorses a sore throat.

## 2024-02-25 ENCOUNTER — Ambulatory Visit: Payer: Self-pay

## 2024-03-15 ENCOUNTER — Ambulatory Visit: Payer: Self-pay

## 2024-04-12 ENCOUNTER — Encounter (HOSPITAL_COMMUNITY): Payer: Self-pay | Admitting: Emergency Medicine

## 2024-04-12 ENCOUNTER — Emergency Department (HOSPITAL_COMMUNITY)
Admission: EM | Admit: 2024-04-12 | Discharge: 2024-04-12 | Disposition: A | Discharge status: Home / Self Care | Attending: Emergency Medicine | Admitting: Emergency Medicine

## 2024-04-12 ENCOUNTER — Other Ambulatory Visit: Payer: Self-pay

## 2024-04-12 DIAGNOSIS — R7309 Other abnormal glucose: Secondary | ICD-10-CM | POA: Insufficient documentation

## 2024-04-12 DIAGNOSIS — L0501 Pilonidal cyst with abscess: Secondary | ICD-10-CM | POA: Diagnosis present

## 2024-04-12 LAB — CBG MONITORING, ED: Glucose-Capillary: 108 mg/dL — ABNORMAL HIGH (ref 70–99)

## 2024-04-12 MED ORDER — DOXYCYCLINE HYCLATE 100 MG PO TABS
100.0000 mg | ORAL_TABLET | Freq: Once | ORAL | Status: AC
  Administered 2024-04-12: 100 mg via ORAL
  Filled 2024-04-12: qty 1

## 2024-04-12 MED ORDER — LIDOCAINE HCL (PF) 1 % IJ SOLN
5.0000 mL | Freq: Once | INTRAMUSCULAR | Status: AC
  Administered 2024-04-12: 5 mL
  Filled 2024-04-12: qty 5

## 2024-04-12 MED ORDER — DOXYCYCLINE HYCLATE 100 MG PO CAPS: 100.0000 mg | ORAL_CAPSULE | Freq: Two times a day (BID) | ORAL | 0 refills | Status: AC

## 2024-04-12 NOTE — ED Triage Notes (Signed)
 Abscess to sacral area  x 3 days that has been recurrent for years

## 2024-04-12 NOTE — Discharge Instructions (Signed)
 Take the entire course of the antibiotics prescribed, your next dose tomorrow morning as you have received a dose here.  Also I recommend a 10 to 15-minute warm water soak with Epsom salt twice daily as discussed to help continue drawing this infection out of this abscess pocket.  If the packing still remains in place on Saturday you can remove it at that time.  Call Dr. Kallie for follow-up care as discussed.  Your blood pressure is elevated today.  I do recommend close follow-up with the primary provider to establish care, I am given new several names who may be accepting new patients.

## 2024-04-12 NOTE — ED Provider Notes (Signed)
 [place] EMERGENCY DEPARTMENT AT [facility] Provider Note   CSN: [id number] Arrival date & time: 04/12/24  1658     Patient presents with: Abscess   Patrick Pierce is a 38 y.o. male.   The history is provided by the patient.  Abscess Location:  Pelvis Pelvic abscess location:  R buttock (right buttock near pilonidal space) Abscess quality: fluctuance and painful   Red streaking: no   Duration:  3 days Progression:  Worsening Pain details:    Quality:  Pressure, throbbing and sharp   Progression:  Worsening Chronicity:  Recurrent Context: not diabetes and not immunosuppression   Relieved by:  Nothing Exacerbated by: palpation. Ineffective treatments:  None tried Associated symptoms: no fever, no headaches, no nausea and no vomiting   Risk factors: prior abscess    Patient reports has had problems with abscesses going on for several years now, he has had a few in his axilla, but mostly have been pilonidal in location.  Has been advised in the past he needs surgical intervention but until recently he did not have insurance to obtain this definitive care.  He is motivated to be seen by a Careers adviser for this is soon as possible.  With his significant pain currently he is amenable to I&D for some temporary relief.    Prior to Admission medications   Medication Sig Start Date End Date Taking? Authorizing Provider  doxycycline  (VIBRAMYCIN ) 100 MG capsule Take 1 capsule (100 mg total) by mouth 2 (two) times daily. 04/12/24  Yes Briza Bark, PA-C  acetaminophen  (TYLENOL ) 650 MG CR tablet Take 1,300 mg by mouth every 8 (eight) hours as needed for pain.    [provider]  albuterol  (VENTOLIN  HFA) 108 (90 Base) MCG/ACT inhaler Inhale 1-2 puffs into the lungs every 6 (six) hours as needed for wheezing or shortness of breath. 11/18/23   Suellen Cantor A, PA-C  amoxicillin -clavulanate (AUGMENTIN ) 875-125 MG tablet Take 1 tablet by mouth every 12 (twelve) hours. 11/18/23    Suellen Cantor A, PA-C    Allergies: Aspirin    Review of Systems  Constitutional:  Negative for fever.  Eyes: Negative.   Respiratory:  Negative for chest tightness and shortness of breath.   Cardiovascular:  Negative for chest pain.  Gastrointestinal:  Negative for abdominal pain, nausea and vomiting.  Genitourinary: Negative.   Musculoskeletal:  Negative for arthralgias, joint swelling and neck pain.  Skin:  Positive for wound. Negative for rash.  Neurological:  Negative for weakness and headaches.  Psychiatric/Behavioral: Negative.      Updated Vital Signs BP (!) 156/100 (BP Location: Right Wrist)   Pulse 72   Temp 98.8 F (37.1 C) (Oral)   Resp 16   SpO2 99%   Physical Exam Vitals and nursing note reviewed.  Constitutional:      Appearance: He is well-developed.  HENT:     Head: Normocephalic and atraumatic.  Eyes:     Conjunctiva/sclera: Conjunctivae normal.  Cardiovascular:     Rate and Rhythm: Normal rate.  Pulmonary:     Effort: Pulmonary effort is normal.  Musculoskeletal:        General: Normal range of motion.     Cervical back: Normal range of motion.  Skin:    General: Skin is warm and dry.     Findings: Abscess present.     Comments: There is a fluctuant abscess left upper buttock near midline, distal to this fluctuance there is an additional indurated area measuring approximately  2 cm.  No active draining from either site.  There is an additional lesion right upper buttock at similar level which appears to have recently drained and is healing.  Neurological:     Mental Status: He is alert.     (all labs ordered are listed, but only abnormal results are displayed) Labs Reviewed  CBG MONITORING, ED - Abnormal; Notable for the following components:      Result Value   Glucose-Capillary 108 (*)    All other components within normal limits    EKG: None  Radiology: No results found.   Procedures   INCISION AND DRAINAGE Performed by: Mliss Narrow Consent: Verbal consent obtained. Risks and benefits: risks, benefits and alternatives were discussed Type: abscess  Body area: left upper buttock  Anesthesia: local infiltration  Incision was made with a scalpel.  Local anesthetic: lidocaine  1% with epinephrine   Anesthetic total: 5 ml  Complexity: complex Blunt dissection to break up loculations  Drainage: purulent  Drainage amount: copious  Packing material: 1/4 in iodoform gauze, lightly packed with approx 4 inch gauze.  Patient tolerance: Patient tolerated the procedure well with no immediate complications.    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  He tolerated I&D today without difficulty, placed on doxycycline , we discussed home care including warm soaks, he is comfortable removing the packing in 2 to 3 days, return precautions were outlined.  He was given a referral to Dr. Kallie for definitive care.  CBG screening normal range.  It was noted that he was hypertensive here, this was discussed and he is desirous of obtaining a PCP now that he has medical insurance.  He was given referrals for some suggestions for PCP.  Amount and/or Complexity of Data Reviewed Labs: ordered.    Details: CBG normal at 108, this is a 2-hour postprandial  Risk Prescription drug management.        Final diagnoses:  Pilonidal abscess    ED Discharge Orders          Ordered    doxycycline  (VIBRAMYCIN ) 100 MG capsule  2 times daily        04/12/24 1835               Jennylee Uehara, PA-C 04/12/24 2248    Freddi Hamilton, MD 04/13/24 [phone]

## 2024-11-03 ENCOUNTER — Ambulatory Visit: Payer: Self-pay | Admitting: Family Medicine

## 2025-04-02 ENCOUNTER — Ambulatory Visit: Payer: Self-pay
